# Patient Record
Sex: Female | Born: 1975 | Race: White | Hispanic: No | Marital: Married | Smoking: Never smoker
Health system: Southern US, Community
[De-identification: ages and names within clinical notes are randomized; demographics above are authoritative.]

## PROBLEM LIST (undated history)

## (undated) DIAGNOSIS — I10 Essential (primary) hypertension: Secondary | ICD-10-CM

## (undated) DIAGNOSIS — K219 Gastro-esophageal reflux disease without esophagitis: Secondary | ICD-10-CM

## (undated) DIAGNOSIS — K589 Irritable bowel syndrome without diarrhea: Secondary | ICD-10-CM

## (undated) DIAGNOSIS — E785 Hyperlipidemia, unspecified: Secondary | ICD-10-CM

## (undated) DIAGNOSIS — F32A Depression, unspecified: Secondary | ICD-10-CM

## (undated) DIAGNOSIS — G43909 Migraine, unspecified, not intractable, without status migrainosus: Secondary | ICD-10-CM

## (undated) DIAGNOSIS — R7611 Nonspecific reaction to tuberculin skin test without active tuberculosis: Secondary | ICD-10-CM

## (undated) DIAGNOSIS — T7840XA Allergy, unspecified, initial encounter: Secondary | ICD-10-CM

## (undated) DIAGNOSIS — F329 Major depressive disorder, single episode, unspecified: Secondary | ICD-10-CM

## (undated) HISTORY — DX: Hyperlipidemia, unspecified: E78.5

## (undated) HISTORY — PX: TYMPANOSTOMY TUBE PLACEMENT: SHX32

## (undated) HISTORY — DX: Depression, unspecified: F32.A

## (undated) HISTORY — DX: Essential (primary) hypertension: I10

## (undated) HISTORY — DX: Allergy, unspecified, initial encounter: T78.40XA

## (undated) HISTORY — PX: ADENOIDECTOMY: SUR15

## (undated) HISTORY — DX: Irritable bowel syndrome, unspecified: K58.9

## (undated) HISTORY — DX: Major depressive disorder, single episode, unspecified: F32.9

## (undated) HISTORY — DX: Migraine, unspecified, not intractable, without status migrainosus: G43.909

## (undated) HISTORY — DX: Gastro-esophageal reflux disease without esophagitis: K21.9

## (undated) HISTORY — DX: Nonspecific reaction to tuberculin skin test without active tuberculosis: R76.11

---

## 1999-10-30 ENCOUNTER — Other Ambulatory Visit: Admission: RE | Admit: 1999-10-30 | Discharge: 1999-10-30 | Payer: Self-pay | Admitting: Obstetrics and Gynecology

## 2000-11-02 ENCOUNTER — Other Ambulatory Visit: Admission: RE | Admit: 2000-11-02 | Discharge: 2000-11-02 | Payer: Self-pay | Admitting: Obstetrics and Gynecology

## 2000-12-28 ENCOUNTER — Encounter: Admission: RE | Admit: 2000-12-28 | Discharge: 2000-12-28 | Payer: Self-pay | Admitting: *Deleted

## 2001-01-04 ENCOUNTER — Encounter: Admission: RE | Admit: 2001-01-04 | Discharge: 2001-01-04 | Payer: Self-pay | Admitting: *Deleted

## 2001-01-09 ENCOUNTER — Encounter: Admission: RE | Admit: 2001-01-09 | Discharge: 2001-01-09 | Payer: Self-pay | Admitting: *Deleted

## 2001-01-10 ENCOUNTER — Encounter: Admission: RE | Admit: 2001-01-10 | Discharge: 2001-01-10 | Payer: Self-pay | Admitting: *Deleted

## 2001-01-25 ENCOUNTER — Encounter: Admission: RE | Admit: 2001-01-25 | Discharge: 2001-01-25 | Payer: Self-pay | Admitting: *Deleted

## 2001-02-15 ENCOUNTER — Encounter: Admission: RE | Admit: 2001-02-15 | Discharge: 2001-02-15 | Payer: Self-pay | Admitting: *Deleted

## 2001-02-22 ENCOUNTER — Encounter: Admission: RE | Admit: 2001-02-22 | Discharge: 2001-02-22 | Payer: Self-pay | Admitting: *Deleted

## 2001-03-01 ENCOUNTER — Encounter: Admission: RE | Admit: 2001-03-01 | Discharge: 2001-03-01 | Payer: Self-pay | Admitting: *Deleted

## 2001-03-21 ENCOUNTER — Encounter: Payer: Self-pay | Admitting: Family Medicine

## 2001-03-21 ENCOUNTER — Encounter: Admission: RE | Admit: 2001-03-21 | Discharge: 2001-03-21 | Payer: Self-pay | Admitting: Family Medicine

## 2001-05-31 ENCOUNTER — Encounter: Payer: Self-pay | Admitting: Emergency Medicine

## 2001-05-31 ENCOUNTER — Emergency Department (HOSPITAL_COMMUNITY): Admission: EM | Admit: 2001-05-31 | Discharge: 2001-05-31 | Payer: Self-pay | Admitting: Emergency Medicine

## 2001-11-16 ENCOUNTER — Other Ambulatory Visit: Admission: RE | Admit: 2001-11-16 | Discharge: 2001-11-16 | Payer: Self-pay | Admitting: Obstetrics and Gynecology

## 2002-11-29 ENCOUNTER — Other Ambulatory Visit: Admission: RE | Admit: 2002-11-29 | Discharge: 2002-11-29 | Payer: Self-pay | Admitting: *Deleted

## 2003-04-28 ENCOUNTER — Inpatient Hospital Stay (HOSPITAL_COMMUNITY): Admission: EM | Admit: 2003-04-28 | Discharge: 2003-05-02 | Payer: Self-pay | Admitting: Internal Medicine

## 2003-04-28 ENCOUNTER — Emergency Department (HOSPITAL_COMMUNITY): Admission: AD | Admit: 2003-04-28 | Discharge: 2003-04-28 | Payer: Self-pay | Admitting: Family Medicine

## 2003-10-31 ENCOUNTER — Other Ambulatory Visit: Admission: RE | Admit: 2003-10-31 | Discharge: 2003-10-31 | Payer: Self-pay | Admitting: Obstetrics and Gynecology

## 2004-02-04 ENCOUNTER — Ambulatory Visit: Payer: Self-pay | Admitting: Family Medicine

## 2004-03-02 ENCOUNTER — Ambulatory Visit: Payer: Self-pay | Admitting: Family Medicine

## 2004-03-09 ENCOUNTER — Ambulatory Visit: Payer: Self-pay | Admitting: Family Medicine

## 2004-06-22 ENCOUNTER — Ambulatory Visit: Payer: Self-pay | Admitting: Family Medicine

## 2004-10-16 ENCOUNTER — Ambulatory Visit: Payer: Self-pay | Admitting: Family Medicine

## 2005-01-21 ENCOUNTER — Other Ambulatory Visit: Admission: RE | Admit: 2005-01-21 | Discharge: 2005-01-21 | Payer: Self-pay | Admitting: Obstetrics and Gynecology

## 2005-01-26 ENCOUNTER — Ambulatory Visit: Payer: Self-pay | Admitting: Family Medicine

## 2005-07-06 ENCOUNTER — Ambulatory Visit: Payer: Self-pay | Admitting: Family Medicine

## 2005-07-15 ENCOUNTER — Ambulatory Visit: Payer: Self-pay | Admitting: Family Medicine

## 2005-11-18 ENCOUNTER — Ambulatory Visit: Payer: Self-pay | Admitting: Internal Medicine

## 2005-12-01 ENCOUNTER — Ambulatory Visit: Payer: Self-pay | Admitting: Family Medicine

## 2005-12-01 LAB — CONVERTED CEMR LAB
Chloride: 105 meq/L (ref 96–112)
GFR calc non Af Amer: 104 mL/min
Glucose, Bld: 86 mg/dL (ref 70–99)
Potassium: 5.1 meq/L (ref 3.5–5.1)
Sodium: 139 meq/L (ref 135–145)

## 2005-12-19 ENCOUNTER — Emergency Department (HOSPITAL_COMMUNITY): Admission: EM | Admit: 2005-12-19 | Discharge: 2005-12-19 | Payer: Self-pay | Admitting: Family Medicine

## 2006-01-18 ENCOUNTER — Encounter: Payer: Self-pay | Admitting: Family Medicine

## 2006-01-18 LAB — CONVERTED CEMR LAB

## 2006-01-27 ENCOUNTER — Ambulatory Visit: Payer: Self-pay | Admitting: Family Medicine

## 2006-03-10 ENCOUNTER — Encounter: Admission: RE | Admit: 2006-03-10 | Discharge: 2006-03-10 | Payer: Self-pay | Admitting: Allergy and Immunology

## 2006-04-04 ENCOUNTER — Ambulatory Visit: Payer: Self-pay | Admitting: Family Medicine

## 2006-06-14 ENCOUNTER — Encounter: Payer: Self-pay | Admitting: Family Medicine

## 2006-06-14 DIAGNOSIS — E785 Hyperlipidemia, unspecified: Secondary | ICD-10-CM | POA: Insufficient documentation

## 2006-06-14 DIAGNOSIS — G43909 Migraine, unspecified, not intractable, without status migrainosus: Secondary | ICD-10-CM | POA: Insufficient documentation

## 2006-06-14 DIAGNOSIS — K589 Irritable bowel syndrome without diarrhea: Secondary | ICD-10-CM

## 2006-06-14 DIAGNOSIS — F329 Major depressive disorder, single episode, unspecified: Secondary | ICD-10-CM

## 2006-06-14 DIAGNOSIS — J309 Allergic rhinitis, unspecified: Secondary | ICD-10-CM | POA: Insufficient documentation

## 2006-08-22 ENCOUNTER — Encounter: Payer: Self-pay | Admitting: Internal Medicine

## 2006-08-22 ENCOUNTER — Ambulatory Visit: Payer: Self-pay | Admitting: Internal Medicine

## 2006-08-22 DIAGNOSIS — L259 Unspecified contact dermatitis, unspecified cause: Secondary | ICD-10-CM

## 2006-10-03 ENCOUNTER — Telehealth: Payer: Self-pay | Admitting: Family Medicine

## 2006-10-14 ENCOUNTER — Ambulatory Visit: Payer: Self-pay | Admitting: Family Medicine

## 2006-10-19 ENCOUNTER — Ambulatory Visit: Payer: Self-pay | Admitting: Family Medicine

## 2006-10-19 LAB — CONVERTED CEMR LAB
AST: 18 units/L (ref 0–37)
Bilirubin, Direct: 0.1 mg/dL (ref 0.0–0.3)
Chloride: 110 meq/L (ref 96–112)
Creatinine, Ser: 0.7 mg/dL (ref 0.4–1.2)
Direct LDL: 165.5 mg/dL
Eosinophils Relative: 1.7 % (ref 0.0–5.0)
Glucose, Bld: 74 mg/dL (ref 70–99)
HCT: 43 % (ref 36.0–46.0)
Hemoglobin: 14.8 g/dL (ref 12.0–15.0)
MCV: 84.7 fL (ref 78.0–100.0)
Monocytes Absolute: 0.5 10*3/uL (ref 0.2–0.7)
Neutrophils Relative %: 55 % (ref 43.0–77.0)
Potassium: 3.9 meq/L (ref 3.5–5.1)
RBC: 5.08 M/uL (ref 3.87–5.11)
RDW: 12.6 % (ref 11.5–14.6)
Sodium: 145 meq/L (ref 135–145)
Total Bilirubin: 0.5 mg/dL (ref 0.3–1.2)
Total CHOL/HDL Ratio: 7
Total Protein: 6.4 g/dL (ref 6.0–8.3)
Triglycerides: 108 mg/dL (ref 0–149)
WBC: 6.3 10*3/uL (ref 4.5–10.5)

## 2006-10-20 ENCOUNTER — Telehealth: Payer: Self-pay | Admitting: Family Medicine

## 2006-10-21 ENCOUNTER — Telehealth: Payer: Self-pay | Admitting: Family Medicine

## 2006-11-25 ENCOUNTER — Ambulatory Visit: Payer: Self-pay | Admitting: Family Medicine

## 2007-02-02 ENCOUNTER — Telehealth: Payer: Self-pay | Admitting: Family Medicine

## 2007-02-13 ENCOUNTER — Ambulatory Visit: Payer: Self-pay | Admitting: Family Medicine

## 2007-02-13 DIAGNOSIS — M26609 Unspecified temporomandibular joint disorder, unspecified side: Secondary | ICD-10-CM | POA: Insufficient documentation

## 2007-02-24 ENCOUNTER — Telehealth: Payer: Self-pay | Admitting: Family Medicine

## 2007-03-06 ENCOUNTER — Telehealth: Payer: Self-pay | Admitting: Family Medicine

## 2007-03-07 ENCOUNTER — Ambulatory Visit: Payer: Self-pay | Admitting: Family Medicine

## 2007-03-07 ENCOUNTER — Telehealth: Payer: Self-pay | Admitting: Family Medicine

## 2007-03-07 DIAGNOSIS — R21 Rash and other nonspecific skin eruption: Secondary | ICD-10-CM | POA: Insufficient documentation

## 2007-03-28 ENCOUNTER — Ambulatory Visit: Payer: Self-pay | Admitting: Family Medicine

## 2007-03-28 DIAGNOSIS — J069 Acute upper respiratory infection, unspecified: Secondary | ICD-10-CM | POA: Insufficient documentation

## 2007-04-24 ENCOUNTER — Telehealth: Payer: Self-pay | Admitting: Family Medicine

## 2007-04-24 ENCOUNTER — Ambulatory Visit: Payer: Self-pay | Admitting: Family Medicine

## 2007-04-26 ENCOUNTER — Ambulatory Visit: Payer: Self-pay | Admitting: Family Medicine

## 2007-04-26 DIAGNOSIS — J209 Acute bronchitis, unspecified: Secondary | ICD-10-CM

## 2007-04-27 ENCOUNTER — Encounter: Payer: Self-pay | Admitting: Family Medicine

## 2007-05-26 ENCOUNTER — Telehealth: Payer: Self-pay | Admitting: Family Medicine

## 2007-06-19 ENCOUNTER — Encounter: Payer: Self-pay | Admitting: Family Medicine

## 2007-07-06 ENCOUNTER — Ambulatory Visit: Payer: Self-pay | Admitting: Family Medicine

## 2007-07-07 LAB — CONVERTED CEMR LAB
ALT: 33 units/L (ref 0–35)
Basophils Absolute: 0 10*3/uL (ref 0.0–0.1)
Bilirubin, Direct: 0.1 mg/dL (ref 0.0–0.3)
CO2: 24 meq/L (ref 19–32)
Calcium: 8.8 mg/dL (ref 8.4–10.5)
Hemoglobin: 14.9 g/dL (ref 12.0–15.0)
Lymphocytes Relative: 30.4 % (ref 12.0–46.0)
MCHC: 34.8 g/dL (ref 30.0–36.0)
Neutro Abs: 5.4 10*3/uL (ref 1.4–7.7)
Neutrophils Relative %: 62.8 % (ref 43.0–77.0)
Platelets: 328 10*3/uL (ref 150–400)
Potassium: 3.9 meq/L (ref 3.5–5.1)
RDW: 13.6 % (ref 11.5–14.6)
Sodium: 140 meq/L (ref 135–145)
TSH: 1.82 microintl units/mL (ref 0.35–5.50)
Total Bilirubin: 0.4 mg/dL (ref 0.3–1.2)

## 2007-07-27 ENCOUNTER — Encounter: Payer: Self-pay | Admitting: Family Medicine

## 2007-09-01 ENCOUNTER — Ambulatory Visit: Payer: Self-pay | Admitting: Family Medicine

## 2007-09-01 DIAGNOSIS — K219 Gastro-esophageal reflux disease without esophagitis: Secondary | ICD-10-CM

## 2007-09-01 DIAGNOSIS — I10 Essential (primary) hypertension: Secondary | ICD-10-CM | POA: Insufficient documentation

## 2007-09-01 DIAGNOSIS — J45909 Unspecified asthma, uncomplicated: Secondary | ICD-10-CM

## 2007-09-07 ENCOUNTER — Telehealth: Payer: Self-pay | Admitting: Family Medicine

## 2007-09-08 ENCOUNTER — Ambulatory Visit: Payer: Self-pay | Admitting: Family Medicine

## 2007-09-12 ENCOUNTER — Telehealth: Payer: Self-pay | Admitting: Family Medicine

## 2007-09-18 ENCOUNTER — Encounter: Payer: Self-pay | Admitting: Family Medicine

## 2007-10-02 ENCOUNTER — Telehealth: Payer: Self-pay | Admitting: Family Medicine

## 2007-10-20 ENCOUNTER — Telehealth: Payer: Self-pay | Admitting: Family Medicine

## 2007-10-31 ENCOUNTER — Ambulatory Visit: Payer: Self-pay | Admitting: Family Medicine

## 2007-11-22 ENCOUNTER — Telehealth: Payer: Self-pay | Admitting: Family Medicine

## 2008-01-04 ENCOUNTER — Encounter: Payer: Self-pay | Admitting: Family Medicine

## 2008-03-19 ENCOUNTER — Ambulatory Visit: Payer: Self-pay | Admitting: Family Medicine

## 2008-04-05 ENCOUNTER — Telehealth: Payer: Self-pay | Admitting: Family Medicine

## 2008-07-04 ENCOUNTER — Ambulatory Visit: Payer: Self-pay | Admitting: Family Medicine

## 2008-08-06 ENCOUNTER — Telehealth: Payer: Self-pay | Admitting: Family Medicine

## 2008-08-28 ENCOUNTER — Telehealth: Payer: Self-pay | Admitting: Family Medicine

## 2008-09-16 ENCOUNTER — Ambulatory Visit: Payer: Self-pay | Admitting: Family Medicine

## 2008-10-02 ENCOUNTER — Telehealth: Payer: Self-pay | Admitting: Family Medicine

## 2008-10-15 ENCOUNTER — Ambulatory Visit: Payer: Self-pay | Admitting: Family Medicine

## 2008-10-15 DIAGNOSIS — J019 Acute sinusitis, unspecified: Secondary | ICD-10-CM | POA: Insufficient documentation

## 2008-10-17 ENCOUNTER — Telehealth: Payer: Self-pay | Admitting: Family Medicine

## 2008-10-30 ENCOUNTER — Telehealth: Payer: Self-pay | Admitting: Family Medicine

## 2008-11-28 ENCOUNTER — Ambulatory Visit: Payer: Self-pay | Admitting: Licensed Clinical Social Worker

## 2008-12-05 ENCOUNTER — Ambulatory Visit: Payer: Self-pay | Admitting: Licensed Clinical Social Worker

## 2008-12-20 ENCOUNTER — Telehealth: Payer: Self-pay | Admitting: Family Medicine

## 2009-01-22 ENCOUNTER — Telehealth: Payer: Self-pay | Admitting: Family Medicine

## 2009-01-24 ENCOUNTER — Ambulatory Visit: Payer: Self-pay | Admitting: Family Medicine

## 2009-03-03 ENCOUNTER — Telehealth: Payer: Self-pay | Admitting: Family Medicine

## 2009-03-10 ENCOUNTER — Telehealth: Payer: Self-pay | Admitting: Family Medicine

## 2009-04-14 ENCOUNTER — Ambulatory Visit: Payer: Self-pay | Admitting: Family Medicine

## 2009-04-17 ENCOUNTER — Telehealth: Payer: Self-pay | Admitting: Family Medicine

## 2009-08-22 ENCOUNTER — Ambulatory Visit: Payer: Self-pay | Admitting: Family Medicine

## 2009-08-22 LAB — CONVERTED CEMR LAB
ALT: 21 units/L (ref 0–35)
AST: 20 units/L (ref 0–37)
Alkaline Phosphatase: 83 units/L (ref 39–117)
Eosinophils Relative: 2.5 % (ref 0.0–5.0)
GFR calc non Af Amer: 106.13 mL/min (ref >60)
HCT: 45.1 % (ref 36.0–46.0)
HDL: 40.4 mg/dL (ref >39.00)
Hemoglobin: 15.3 g/dL — ABNORMAL HIGH (ref 12.0–15.0)
Lymphs Abs: 2.3 10*3/uL (ref 0.7–4.0)
Monocytes Relative: 7 % (ref 3.0–12.0)
Nitrite: NEGATIVE
Platelets: 322 10*3/uL (ref 150.0–400.0)
Potassium: 4.4 meq/L (ref 3.5–5.1)
Protein, U semiquant: NEGATIVE
Sodium: 145 meq/L (ref 135–145)
TSH: 3.67 microintl units/mL (ref 0.35–5.50)
Total Bilirubin: 0.4 mg/dL (ref 0.3–1.2)
Urobilinogen, UA: 0.2
VLDL: 21 mg/dL (ref 0.0–40.0)
WBC Urine, dipstick: NEGATIVE
WBC: 6 10*3/uL (ref 4.5–10.5)

## 2009-08-29 ENCOUNTER — Ambulatory Visit: Payer: Self-pay | Admitting: Family Medicine

## 2009-09-23 ENCOUNTER — Telehealth: Payer: Self-pay | Admitting: Family Medicine

## 2009-09-23 DIAGNOSIS — M79609 Pain in unspecified limb: Secondary | ICD-10-CM | POA: Insufficient documentation

## 2009-11-04 ENCOUNTER — Ambulatory Visit: Payer: Self-pay | Admitting: Family Medicine

## 2009-12-17 ENCOUNTER — Ambulatory Visit: Payer: Self-pay | Admitting: Family Medicine

## 2009-12-19 ENCOUNTER — Telehealth: Payer: Self-pay | Admitting: Family Medicine

## 2009-12-29 ENCOUNTER — Telehealth: Payer: Self-pay | Admitting: Family Medicine

## 2010-01-02 ENCOUNTER — Ambulatory Visit: Payer: Self-pay | Admitting: Family Medicine

## 2010-01-27 ENCOUNTER — Telehealth: Payer: Self-pay | Admitting: Family Medicine

## 2010-02-15 LAB — CONVERTED CEMR LAB
ALT: 41 units/L — ABNORMAL HIGH (ref 0–35)
AST: 22 units/L (ref 0–37)
Bilirubin, Direct: 0.1 mg/dL (ref 0.0–0.3)
CO2: 27 meq/L (ref 19–32)
Chloride: 103 meq/L (ref 96–112)
Creatinine, Ser: 0.7 mg/dL (ref 0.4–1.2)
Sodium: 141 meq/L (ref 135–145)
Total Bilirubin: 0.5 mg/dL (ref 0.3–1.2)

## 2010-02-17 NOTE — Assessment & Plan Note (Signed)
Summary: DEPO INJ/RCD   Nurse Visit   Allergies: 1)  Sulfamethoxazole (Sulfamethoxazole) 2)  Avelox (Moxifloxacin Hcl) 3)  Codeine Phosphate (Codeine Phosphate)  Medication Administration  Injection # 1:    Medication: Depo- Medrol 80mg     Diagnosis: ACUTE BRONCHITIS (ICD-466.0)    Route: IM    Site: LUOQ gluteus    Exp Date: 10/11    Lot #: [id number]    Mfr: Teva    Comments: 120mg     Patient tolerated injection without complications    Given by: Alfred Levins, CMA (January 24, 2009 12:45 PM)  Orders Added: 1)  Depo- Medrol 80mg  [J1040] 2)  Admin of Therapeutic Inj  intramuscular or subcutaneous [54098]

## 2010-02-17 NOTE — Progress Notes (Signed)
Summary: REQUEST FOR COUGH MEDICINE  Phone Note Call from Patient Call back at Home Phone [phone]   Caller: Patient Details for Reason: REQUEST FOR COUGH MEDICINE Summary of Call: Patient request that something for a cough be called in to her pharmacy ([facility] - [address]) Initial call taken by: Roney Jaffe,  December 19, 2009 7:19 AM  Follow-up for Phone Call        call in Hydromet 1-2 tsp q 4 hours as needed cough, 240 ml, no rf  Follow-up by: Nelwyn Salisbury MD,  December 19, 2009 8:23 AM  Additional Follow-up for Phone Call Additional follow up Details #1::        done pt aware  Additional Follow-up by: Pura Spice, RN,  December 19, 2009 9:48 AM    New/Updated Medications: HYDROMET 5-1.5 MG/5ML SYRP (HYDROCODONE-HOMATROPINE) 1-2 tsp every 4 hrs as needed cough Prescriptions: HYDROMET 5-1.5 MG/5ML SYRP (HYDROCODONE-HOMATROPINE) 1-2 tsp every 4 hrs as needed cough  #254ml x 0   Entered by:   Pura Spice, RN   Authorized by:   Nelwyn Salisbury MD   Signed by:   Pura Spice, RN on 12/19/2009   Method used:   Telephoned to ...       [address] [phone]* (retail)       [address]       [place], [place]  [address]       Ph: [phone]       Fax: [phone]   RxID:   [id number]

## 2010-02-17 NOTE — Progress Notes (Signed)
Summary: rt toe pain  Phone Note Call from Patient Call back at Work Phone [phone]   Caller: Patient Summary of Call: she is still having a lot of pain in the right great toe despite wearing supportive shoes and taking Motrin.  Initial call taken by: Nelwyn Salisbury MD,  September 23, 2009 8:51 AM  Follow-up for Phone Call        refer to Dr. Orlene Och ASAP for right great toe pain Follow-up by: Nelwyn Salisbury MD,  September 23, 2009 8:51 AM  Additional Follow-up for Phone Call Additional follow up Details #1::        done pt aware. Additional Follow-up by: Pura Spice, RN,  September 23, 2009 9:25 AM  New Problems: FOOT PAIN, RIGHT (ICD-729.5)   New Problems: FOOT PAIN, RIGHT (ICD-729.5)

## 2010-02-17 NOTE — Assessment & Plan Note (Signed)
Summary: SINUSES/HEADACHE/RCD   Vital Signs:  Patient profile:   35 year old female Weight:      195 pounds Temp:     98.8 degrees F oral BP sitting:   120 / 88  (left arm) Cuff size:   regular  Vitals Entered By: Raechel Ache, RN (April 14, 2009 9:15 AM) CC: C/o sinus pressure, sore throat, runny nose, cough, chills, weak.   History of Present Illness: Here with 5 days of sinus pressure, HA, ST, and a dry cough.No fever.   Allergies: 1)  Sulfamethoxazole (Sulfamethoxazole) 2)  Avelox (Moxifloxacin Hcl) 3)  Codeine Phosphate (Codeine Phosphate)  Past History:  Past Medical History: Reviewed history from 09/01/2007 and no changes required. Allergic rhinitis Depression Hyperlipidemia pos PPD, treated with INH sees Julio Sicks (with Dr. Vincente Poli) for gyn exams migraines, has seen Dr. Neale Burly IBS Asthma, sees DR. Whelan Hypertension GERD  Review of Systems  The patient denies anorexia, fever, weight loss, weight gain, vision loss, decreased hearing, hoarseness, chest pain, syncope, dyspnea on exertion, peripheral edema, hemoptysis, abdominal pain, melena, hematochezia, severe indigestion/heartburn, hematuria, incontinence, genital sores, muscle weakness, suspicious skin lesions, transient blindness, difficulty walking, depression, unusual weight change, abnormal bleeding, enlarged lymph nodes, angioedema, breast masses, and testicular masses.    Physical Exam  General:  Well-developed,well-nourished,in no acute distress; alert,appropriate and cooperative throughout examination Head:  Normocephalic and atraumatic without obvious abnormalities. No apparent alopecia or balding. Eyes:  No corneal or conjunctival inflammation noted. EOMI. Perrla. Funduscopic exam benign, without hemorrhages, exudates or papilledema. Vision grossly normal. Ears:  External ear exam shows no significant lesions or deformities.  Otoscopic examination reveals clear canals, tympanic membranes are  intact bilaterally without bulging, retraction, inflammation or discharge. Hearing is grossly normal bilaterally. Nose:  External nasal examination shows no deformity or inflammation. Nasal mucosa are pink and moist without lesions or exudates. Mouth:  Oral mucosa and oropharynx without lesions or exudates.  Teeth in good repair. Neck:  No deformities, masses, or tenderness noted. Lungs:  Normal respiratory effort, chest expands symmetrically. Lungs are clear to auscultation, no crackles or wheezes.   Impression & Recommendations:  Problem # 1:  ACUTE SINUSITIS, UNSPECIFIED (ICD-461.9)  Her updated medication list for this problem includes:    Flonase 50 Mcg/act Susp (Fluticasone propionate) .Marland Kitchen... 2 sprays each nostril once daily    Augmentin 875-125 Mg Tabs (Amoxicillin-pot clavulanate) .Marland Kitchen..Marland Kitchen Two times a day  Orders: Depo- Medrol 80mg  (J1040) Admin of Therapeutic Inj  intramuscular or subcutaneous (84665)  Complete Medication List: 1)  Lorazepam 0.5 Mg Tabs (Lorazepam) .... Three times a day as needed anxiety 2)  Zantac 150 Mg Caps (Ranitidine hcl) .... Once daily 3)  Pulmicort Flexhaler 90 Mcg/act Aepb (Budesonide) .... As needed 4)  Promethazine Hcl 25 Mg Tabs (Promethazine hcl) .Marland Kitchen.. 1 every 4 hours as needed nausea 5)  Ketorolac Tromethamine 10 Mg Tabs (Ketorolac tromethamine) .... Three times a day as needed for ha 6)  Treximet 85-500 Mg Tabs (Sumatriptan-naproxen sodium) .... As needed for ha 7)  Xyzal 5 Mg Tabs (Levocetirizine dihydrochloride) .... Once daily 8)  Flonase 50 Mcg/act Susp (Fluticasone propionate) .... 2 sprays each nostril once daily 9)  Topamax 100 Mg Tabs (Topiramate) .... At bedtime 10)  Fluconazole 150 Mg Tabs (Fluconazole) .... As needed 11)  Lunesta 3 Mg Tabs (Eszopiclone) .... At bedtime 12)  Nexium 40 Mg Cpdr (Esomeprazole magnesium) .... Once daily 13)  Augmentin 875-125 Mg Tabs (Amoxicillin-pot clavulanate) .... Two times a day 14)  Terazol 7 0.4 %  Crea (Terconazole) .... As directed  Patient Instructions: 1)  Please schedule a follow-up appointment as needed .  Prescriptions: TERAZOL 7 0.4 % CREA (TERCONAZOLE) as directed  #1 x 0   Entered and Authorized by:   Nelwyn Salisbury MD   Signed by:   Nelwyn Salisbury MD on 04/14/2009   Method used:   Electronically to        [facility]* (retail)       [address]       [address]/mailing       [place], [place]  [address]       Ph: [phone]       Fax: [phone]   RxID:   [id number] AUGMENTIN 875-125 MG TABS (AMOXICILLIN-POT CLAVULANATE) two times a day  #20 x 0   Entered and Authorized by:   Nelwyn Salisbury MD   Signed by:   Nelwyn Salisbury MD on 04/14/2009   Method used:   Electronically to        [facility]* (retail)       [address]       [address]mail[address]       [place], [place]  [address]       Ph: [phone]       Fax: [phone]   RxID:   [id number]    Medication Administration  Injection # 1:    Medication: Depo- Medrol 80mg     Diagnosis: ACUTE SINUSITIS, UNSPECIFIED (ICD-461.9)    Route: IM    Site: RUOQ gluteus    Exp Date: 11/2011    Lot #: [id number]    Mfr: Pharmacia    Comments: 120 mg given.    Patient tolerated injection without complications    Given by: Raechel Ache, RN (April 14, 2009 9:36 AM)  Orders Added: 1)  Est. Patient Level IV [70623] 2)  Depo- Medrol 80mg  [J1040] 3)  Admin of Therapeutic Inj  intramuscular or subcutaneous [76283]

## 2010-02-17 NOTE — Assessment & Plan Note (Signed)
Summary: cpx no pap//ccm/pt rescd ok per dr fry/ccm   Vital Signs:  Patient profile:   35 year old female Height:      62.5 inches Weight:      195 pounds BMI:     35.22 Pulse rate:   92 / minute Pulse rhythm:   regular BP sitting:   126 / 84  (left arm) Cuff size:   regular  Vitals Entered By: Raechel Ache, RN (August 29, 2009 1:22 PM) CC: CPX, labs done. C/o pain R great toe joint. Sees gyn.   History of Present Illness: 35 yr old female for a cpx. She is doing well. Her asthma is stable, and her GERD is under control. Her HAs are better on Topamax but she still gets some breakthrough HAs around her menses.   Allergies: 1)  Sulfamethoxazole (Sulfamethoxazole) 2)  Avelox (Moxifloxacin Hcl) 3)  Codeine Phosphate (Codeine Phosphate)  Past History:  Past Medical History: Reviewed history from 09/01/2007 and no changes required. Allergic rhinitis Depression Hyperlipidemia pos PPD, treated with INH sees Julio Sicks (with Dr. Vincente Poli) for gyn exams migraines, has seen Dr. Neale Burly IBS Asthma, sees DR. Whelan Hypertension GERD  Past Surgical History: Reviewed history from 09/01/2007 and no changes required. Adenoids removed Tubes in ears  Family History: Reviewed history from 09/01/2007 and no changes required. COPD Family History of Asthma Family History of CAD Female 1st degree relative <50  Social History: Reviewed history from 09/01/2007 and no changes required. Married Never Smoked Alcohol use-no Occupation:nursing aide  Review of Systems  The patient denies anorexia, fever, weight loss, weight gain, vision loss, decreased hearing, hoarseness, chest pain, syncope, dyspnea on exertion, peripheral edema, prolonged cough, hemoptysis, abdominal pain, melena, hematochezia, severe indigestion/heartburn, hematuria, incontinence, genital sores, muscle weakness, suspicious skin lesions, transient blindness, difficulty walking, depression, unusual weight change,  abnormal bleeding, enlarged lymph nodes, angioedema, breast masses, and testicular masses.    Physical Exam  General:  overweight-appearing.   Head:  Normocephalic and atraumatic without obvious abnormalities. No apparent alopecia or balding. Eyes:  No corneal or conjunctival inflammation noted. EOMI. Perrla. Funduscopic exam benign, without hemorrhages, exudates or papilledema. Vision grossly normal. Ears:  External ear exam shows no significant lesions or deformities.  Otoscopic examination reveals clear canals, tympanic membranes are intact bilaterally without bulging, retraction, inflammation or discharge. Hearing is grossly normal bilaterally. Nose:  External nasal examination shows no deformity or inflammation. Nasal mucosa are pink and moist without lesions or exudates. Mouth:  Oral mucosa and oropharynx without lesions or exudates.  Teeth in good repair. Neck:  No deformities, masses, or tenderness noted. Chest Wall:  No deformities, masses, or tenderness noted. Lungs:  Normal respiratory effort, chest expands symmetrically. Lungs are clear to auscultation, no crackles or wheezes. Heart:  Normal rate and regular rhythm. S1 and S2 normal without gallop, murmur, click, rub or other extra sounds. Abdomen:  Bowel sounds positive,abdomen soft and non-tender without masses, organomegaly or hernias noted. Msk:  No deformity or scoliosis noted of thoracic or lumbar spine.   Pulses:  R and L carotid,radial,femoral,dorsalis pedis and posterior tibial pulses are full and equal bilaterally Extremities:  No clubbing, cyanosis, edema, or deformity noted with normal full range of motion of all joints.   Neurologic:  No cranial nerve deficits noted. Station and gait are normal. Plantar reflexes are down-going bilaterally. DTRs are symmetrical throughout. Sensory, motor and coordinative functions appear intact. Skin:  Intact without suspicious lesions or rashes Cervical Nodes:  No lymphadenopathy  noted Axillary Nodes:  No palpable lymphadenopathy Inguinal Nodes:  No significant adenopathy Psych:  Cognition and judgment appear intact. Alert and cooperative with normal attention span and concentration. No apparent delusions, illusions, hallucinations   Impression & Recommendations:  Problem # 1:  PHYSICAL EXAMINATION (ICD-V70.0)  Complete Medication List: 1)  Lorazepam 0.5 Mg Tabs (Lorazepam) .... Three times a day as needed anxiety 2)  Pulmicort Flexhaler 90 Mcg/act Aepb (Budesonide) .... As needed 3)  Promethazine Hcl 25 Mg Tabs (Promethazine hcl) .Marland Kitchen.. 1 every 4 hours as needed nausea 4)  Ketorolac Tromethamine 10 Mg Tabs (Ketorolac tromethamine) .... Three times a day as needed for ha 5)  Treximet 85-500 Mg Tabs (Sumatriptan-naproxen sodium) .... As needed for ha 6)  Xyzal 5 Mg Tabs (Levocetirizine dihydrochloride) .... Once daily 7)  Flonase 50 Mcg/act Susp (Fluticasone propionate) .... 2 sprays each nostril once daily 8)  Topamax 100 Mg Tabs (Topiramate) .... At bedtime 9)  Lunesta 3 Mg Tabs (Eszopiclone) .... At bedtime 10)  Nexium 40 Mg Cpdr (Esomeprazole magnesium) .... Once daily  Patient Instructions: 1)  It is important that you exercise reguarly at least 20 minutes 5 times a week. If you develop chest pain, have severe difficulty breathing, or feel very tired, stop exercising immediately and seek medical attention.  2)  You need to lose weight. Consider a lower calorie diet and regular exercise.  3)  Try increasing Topamax to 2 tabs (200 mg) a day for a month, then let me know Prescriptions: LORAZEPAM 0.5 MG TABS (LORAZEPAM) three times a day as needed anxiety  #60 x 2   Entered and Authorized by:   Nelwyn Salisbury MD   Signed by:   Nelwyn Salisbury MD on 08/29/2009   Method used:   Print then Give to Patient   RxID:   [id number]    Immunization History:  Hepatitis B Immunization History:    Hepatitis B # 1:  historical (05/05/1995)    Hepatitis B # 2:   historical (05/31/1995)    Hepatitis B # 3:  historical (11/01/1995)  MMR Immunization History:    MMR # 1:  historical (04/19/1975)    MMR # 2:  historical (05/31/1995)  Varicella Immunization History:    History of chickenpox:  yes (08/18/1977)

## 2010-02-17 NOTE — Progress Notes (Signed)
Summary: rx for nexium and going back to lunesta 3mg   Phone Note Call from Patient Call back at [facility] Phone [phone]   Caller: Patient Summary of Call: Lunesta 2mg  didn't help. Tossed and turned both friday and sat night. Would like to go back on 3mg . Also need a rx for aciphex. But would like to have nexium instead called into [facility] because it doesn't cost anything. Initial call taken by: Romualdo Bolk, CMA Duncan Dull),  March 10, 2009 7:49 AM  Follow-up for Phone Call        done Follow-up by: Nelwyn Salisbury MD,  March 10, 2009 8:21 AM    New/Updated Medications: LUNESTA 3 MG TABS (ESZOPICLONE) at bedtime NEXIUM 40 MG CPDR (ESOMEPRAZOLE MAGNESIUM) once daily Prescriptions: NEXIUM 40 MG CPDR (ESOMEPRAZOLE MAGNESIUM) once daily  #30 x 11   Entered and Authorized by:   Nelwyn Salisbury MD   Signed by:   Nelwyn Salisbury MD on 03/10/2009   Method used:   Print then Give to Patient   RxID:   [id number] LUNESTA 3 MG TABS (ESZOPICLONE) at bedtime  #30 x 5   Entered and Authorized by:   Nelwyn Salisbury MD   Signed by:   Nelwyn Salisbury MD on 03/10/2009   Method used:   Print then Give to Patient   RxID:   [id number]

## 2010-02-17 NOTE — Assessment & Plan Note (Signed)
Summary: sinuses//ccm   Vital Signs:  Patient profile:   35 year old female Weight:      199 pounds O2 Sat:      98 % Temp:     99.3 degrees F Pulse rate:   106 / minute BP sitting:   130 / 94  (left arm) Cuff size:   large  Vitals Entered By: Pura Spice, RN (December 17, 2009 9:29 AM) CC: sinus cough headache took otc tylenol cold and nyquil last nite    History of Present Illness: Here for 4 days of sinus pressure and pain, HA, PND, and a dry cough. Some fever. On Tylenol and Sudafed.   Allergies: 1)  ! * Topamax 2)  Sulfamethoxazole (Sulfamethoxazole) 3)  Avelox (Moxifloxacin Hcl) 4)  Codeine Phosphate (Codeine Phosphate)  Past History:  Past Medical History: Reviewed history from 09/01/2007 and no changes required. Allergic rhinitis Depression Hyperlipidemia pos PPD, treated with INH sees Julio Sicks (with Dr. Vincente Poli) for gyn exams migraines, has seen Dr. Neale Burly IBS Asthma, sees DR. Whelan Hypertension GERD  Review of Systems  The patient denies anorexia, weight loss, weight gain, vision loss, decreased hearing, hoarseness, chest pain, syncope, dyspnea on exertion, peripheral edema, hemoptysis, abdominal pain, melena, hematochezia, severe indigestion/heartburn, hematuria, incontinence, genital sores, muscle weakness, suspicious skin lesions, transient blindness, difficulty walking, depression, unusual weight change, abnormal bleeding, enlarged lymph nodes, angioedema, breast masses, and testicular masses.    Physical Exam  General:  Well-developed,well-nourished,in no acute distress; alert,appropriate and cooperative throughout examination Head:  Normocephalic and atraumatic without obvious abnormalities. No apparent alopecia or balding. Eyes:  No corneal or conjunctival inflammation noted. EOMI. Perrla. Funduscopic exam benign, without hemorrhages, exudates or papilledema. Vision grossly normal. Ears:  External ear exam shows no significant lesions or  deformities.  Otoscopic examination reveals clear canals, tympanic membranes are intact bilaterally without bulging, retraction, inflammation or discharge. Hearing is grossly normal bilaterally. Nose:  External nasal examination shows no deformity or inflammation. Nasal mucosa are pink and moist without lesions or exudates. Mouth:  Oral mucosa and oropharynx without lesions or exudates.  Teeth in good repair. Neck:  No deformities, masses, or tenderness noted. Lungs:  Normal respiratory effort, chest expands symmetrically. Lungs are clear to auscultation, no crackles or wheezes.   Impression & Recommendations:  Problem # 1:  ACUTE SINUSITIS, UNSPECIFIED (ICD-461.9)  Her updated medication list for this problem includes:    Flonase 50 Mcg/act Susp (Fluticasone propionate) .Marland Kitchen... 2 sprays each nostril once daily    Ceftin 500 Mg Tabs (Cefuroxime axetil) .Marland Kitchen..Marland Kitchen Two times a day  Orders: Depo- Medrol 40mg  (J1030) Depo- Medrol 80mg  (J1040) Admin of Therapeutic Inj  intramuscular or subcutaneous (81191)  Complete Medication List: 1)  Lorazepam 0.5 Mg Tabs (Lorazepam) .... Three times a day as needed anxiety 2)  Pulmicort Flexhaler 90 Mcg/act Aepb (Budesonide) .... As needed 3)  Promethazine Hcl 25 Mg Tabs (Promethazine hcl) .Marland Kitchen.. 1 every 4 hours as needed nausea 4)  Ketorolac Tromethamine 10 Mg Tabs (Ketorolac tromethamine) .... Three times a day as needed for ha 5)  Treximet 85-500 Mg Tabs (Sumatriptan-naproxen sodium) .... As needed for ha 6)  Xyzal 5 Mg Tabs (Levocetirizine dihydrochloride) .... Once daily 7)  Flonase 50 Mcg/act Susp (Fluticasone propionate) .... 2 sprays each nostril once daily 8)  Lunesta 3 Mg Tabs (Eszopiclone) .... At bedtime 9)  Nexium 40 Mg Cpdr (Esomeprazole magnesium) .... Once daily 10)  Venlafaxine Hcl 75 Mg Xr24h-cap (Venlafaxine hcl) .... Once daily 11)  Depakote 125 Mg Tbec (Divalproex sodium) .... Once daily 12)  Ceftin 500 Mg Tabs (Cefuroxime axetil) .... Two  times a day  Patient Instructions: 1)  I suggested she try a [employer] as well.  2)  Please schedule a follow-up appointment as needed .  Prescriptions: CEFTIN 500 MG TABS (CEFUROXIME AXETIL) two times a day  #20 x 0   Entered and Authorized by:   Nelwyn Salisbury MD   Signed by:   Nelwyn Salisbury MD on 12/17/2009   Method used:   Electronically to        [address] [phone]* (retail)       [address]       [place], [place]  [address]       Ph: [phone]       Fax: [phone]   RxID:   [id number]    Medication Administration  Injection # 1:    Medication: Depo- Medrol 40mg     Diagnosis: ACUTE SINUSITIS, UNSPECIFIED (ICD-461.9)    Route: IM    Site: RUOQ gluteus    Exp Date: 07/2012    Lot #: [id number]    Mfr: Pharmacia    Patient tolerated injection without complications    Given by: Pura Spice, RN (December 17, 2009 11:41 AM)  Injection # 2:    Medication: Depo- Medrol 80mg     Diagnosis: ACUTE SINUSITIS, UNSPECIFIED (ICD-461.9)    Route: IM    Site: RUOQ gluteus    Exp Date: 07/2012    Lot #: [id number]    Mfr: Pharmacia    Patient tolerated injection without complications    Given by: Pura Spice, RN (December 17, 2009 11:42 AM)  Orders Added: 1)  Est. Patient Level IV [59563] 2)  Depo- Medrol 40mg  [J1030] 3)  Depo- Medrol 80mg  [J1040] 4)  Admin of Therapeutic Inj  intramuscular or subcutaneous [87564]

## 2010-02-17 NOTE — Letter (Signed)
Summary: Staff/Volunteer Physical Exam Form/[place]  Staff/Volunteer Physical Exam Form/[facility] [place]   Imported By: Maryln Gottron 09/02/2009 09:45:51  _____________________________________________________________________  External Attachment:    Type:   Image     Comment:   External Document

## 2010-02-17 NOTE — Progress Notes (Signed)
Summary: problems with augmentin  Phone Note Call from Patient Call back at Home Phone [phone]   Caller: Patient Summary of Call: Augmentin causing severe diarrhea in am for past 3 days. I take 2 pepto and seem to do okay the rest of the day. But right now, I'm not so sure. Still having diarrhea.  Also I took a xyzal this am and was wondering if it would be okay to take one tonight as well because my symptoms are worse first thing in the am with the nasal congestion and watery eyes? I was thinking if I start taking it at night that would be better than during the day. Initial call taken by: Romualdo Bolk, CMA Duncan Dull),  April 17, 2009 8:32 AM  Follow-up for Phone Call        Go ahead and use Pepto-Bismol as needed. If not better by tomorrow, we can switch the antibiotic . It is okay to use Xyzal at night. Follow-up by: Nelwyn Salisbury MD,  April 17, 2009 9:22 AM

## 2010-02-17 NOTE — Progress Notes (Signed)
Summary: pain with walking  Phone Note Call from Patient   Caller: pt Summary of Call: pt reports pain in rt great toe and foot which is worse than last week. Hx of rt fx toe in 1999. No reddness no swelling. Difficulty in walking. Pt would like to be referred .  Initial call taken by: Pura Spice, RN,  September 23, 2009 8:00 AM  Follow-up for Phone Call        this was taken care of Follow-up by: Nelwyn Salisbury MD,  September 23, 2009 8:56 AM

## 2010-02-17 NOTE — Progress Notes (Signed)
Summary: lunesta and aciphex  Phone Note Call from Patient Call back at [facility] Phone [phone]   Caller: Patient Summary of Call: Aciphex is helping some but still has some chest pains. Also tried lunesta again over the weekend and it did help with sleep but I was alittle tired the next day. Unsure if that was due to my cycle or the lunesta. I will need some samples of lunesta for camp if you want me stay on it. Initial call taken by: Romualdo Bolk, CMA (AAMA),  March 03, 2009 12:52 PM  Follow-up for Phone Call        try Lunesta 2mg  at bedtime, and try taking Aciphex two times a day . Let me know how this works Follow-up by: Nelwyn Salisbury MD,  March 03, 2009 1:18 PM  Additional Follow-up for Phone Call Additional follow up Details #1::        pt aware Additional Follow-up by: Alfred Levins, CMA,  March 03, 2009 1:37 PM    New/Updated Medications: ACIPHEX 20 MG TBEC (RABEPRAZOLE SODIUM) once daily LUNESTA 2 MG TABS (ESZOPICLONE) at bedtime as needed

## 2010-02-17 NOTE — Assessment & Plan Note (Signed)
Summary: MIGRAINES/RCD   Vital Signs:  Patient profile:   35 year old female Weight:      199.5 pounds O2 Sat:      98 % Temp:     98.6 degrees F Pulse rate:   88 / minute BP sitting:   120 / 86  (left arm) Cuff size:   regular  Vitals Entered By: Pura Spice, RN (November 04, 2009 9:47 AM) CC: migraine ha  discuss alternative tx    History of Present Illness: Here for a migraine HA that she woke up with today. This is the worst one she has had in many months. it is mostly over the top of her head, it throbs, and she is very nauseated. She has taken a Treximet this am with no response. She had to stop taking Topamax due to tingling in the hands and feet. She is interested in trying a new prophylactic med. Also, her anxiety and depression have been worse for the past 6 months. She is under a lot of job stress, and she and her husband have had no success in trying to get pregnant. They are working with a fertility clinic, and this has been difficult for both of them.   Allergies: 1)  ! * Topamax 2)  Sulfamethoxazole (Sulfamethoxazole) 3)  Avelox (Moxifloxacin Hcl) 4)  Codeine Phosphate (Codeine Phosphate)  Past History:  Past Medical History: Reviewed history from 09/01/2007 and no changes required. Allergic rhinitis Depression Hyperlipidemia pos PPD, treated with INH sees Julio Sicks (with Dr. Vincente Poli) for gyn exams migraines, has seen Dr. Neale Burly IBS Asthma, sees DR. Whelan Hypertension GERD  Review of Systems  The patient denies anorexia, fever, weight loss, weight gain, vision loss, decreased hearing, hoarseness, chest pain, syncope, dyspnea on exertion, peripheral edema, prolonged cough, hemoptysis, abdominal pain, melena, hematochezia, severe indigestion/heartburn, hematuria, incontinence, genital sores, muscle weakness, suspicious skin lesions, transient blindness, difficulty walking, unusual weight change, abnormal bleeding, enlarged lymph nodes, angioedema, breast  masses, and testicular masses.    Physical Exam  General:  wearing dark glasses, very photophobic, in pain but alert Head:  Normocephalic and atraumatic without obvious abnormalities. No apparent alopecia or balding. Eyes:  No corneal or conjunctival inflammation noted. EOMI. Perrla. Funduscopic exam benign, without hemorrhages, exudates or papilledema. Vision grossly normal. Neck:  No deformities, masses, or tenderness noted. Neurologic:  No cranial nerve deficits noted. Station and gait are normal. Plantar reflexes are down-going bilaterally. DTRs are symmetrical throughout. Sensory, motor and coordinative functions appear intact. Psych:  Oriented X3, memory intact for recent and remote, normally interactive, depressed affect, and tearful.     Impression & Recommendations:  Problem # 1:  MIGRAINE HEADACHE (ICD-346.90)  Her updated medication list for this problem includes:    Ketorolac Tromethamine 10 Mg Tabs (Ketorolac tromethamine) .Marland Kitchen... Three times a day as needed for ha    Treximet 85-500 Mg Tabs (Sumatriptan-naproxen sodium) .Marland Kitchen... As needed for ha  Orders: Ketorolac-Toradol 15mg  [phone]) Admin of Therapeutic Inj  intramuscular or subcutaneous (14782) Demerol  100mg   Injection (N5621) Promethazine up to 50mg  (J2550) Admin of Therapeutic Inj  intramuscular or subcutaneous (30865)  Problem # 2:  DEPRESSION (ICD-311)  Her updated medication list for this problem includes:    Lorazepam 0.5 Mg Tabs (Lorazepam) .Marland Kitchen... Three times a day as needed anxiety    Venlafaxine Hcl 75 Mg Xr24h-cap (Venlafaxine hcl) ..... Once daily  Complete Medication List: 1)  Lorazepam 0.5 Mg Tabs (Lorazepam) .... Three times a day as needed  anxiety 2)  Pulmicort Flexhaler 90 Mcg/act Aepb (Budesonide) .... As needed 3)  Promethazine Hcl 25 Mg Tabs (Promethazine hcl) .Marland Kitchen.. 1 every 4 hours as needed nausea 4)  Ketorolac Tromethamine 10 Mg Tabs (Ketorolac tromethamine) .... Three times a day as needed for  ha 5)  Treximet 85-500 Mg Tabs (Sumatriptan-naproxen sodium) .... As needed for ha 6)  Xyzal 5 Mg Tabs (Levocetirizine dihydrochloride) .... Once daily 7)  Flonase 50 Mcg/act Susp (Fluticasone propionate) .... 2 sprays each nostril once daily 8)  Lunesta 3 Mg Tabs (Eszopiclone) .... At bedtime 9)  Nexium 40 Mg Cpdr (Esomeprazole magnesium) .... Once daily 10)  Venlafaxine Hcl 75 Mg Xr24h-cap (Venlafaxine hcl) .... Once daily 11)  Depakote 125 Mg Tbec (Divalproex sodium) .... Once daily  Patient Instructions: 1)  Given injections of Toradol, Demerol, and Phanergan. She will leave work today and have her husband drive her home. She will start back on Effexor daily with occasional Lorazepam. Try Depakote daily for the migraines.  2)  Please schedule a follow-up appointment in 2 weeks.  Prescriptions: TREXIMET 85-500 MG TABS (SUMATRIPTAN-NAPROXEN SODIUM) as needed for HA  #12 x 11   Entered and Authorized by:   Nelwyn Salisbury MD   Signed by:   Nelwyn Salisbury MD on 11/04/2009   Method used:   Electronically to        [facility]* (retail)       [address]       [address] Shipping/mailing       [place], [place]  [address]       Ph: [phone]       Fax: [phone]   RxID:   [id number] DEPAKOTE 125 MG TBEC (DIVALPROEX SODIUM) once daily  #30 x 5   Entered and Authorized by:   Nelwyn Salisbury MD   Signed by:   Nelwyn Salisbury MD on 11/04/2009   Method used:   Electronically to        [address] [phone]* (retail)       [address]       [place], [place]  [address]       Ph: [phone]       Fax: [phone]   RxID:   [id number] VENLAFAXINE HCL 75 MG XR24H-CAP (VENLAFAXINE HCL) once daily  #30 x 5   Entered and Authorized by:   Nelwyn Salisbury MD   Signed by:   Nelwyn Salisbury MD on 11/04/2009   Method used:   Electronically to        [address] [phone]* (retail)       [address]       [place], [place]  [address]       Ph:  [phone]       Fax: [phone]   RxID:   [id number]    Medication Administration  Injection # 1:    Medication: Ketorolac-Toradol 15mg     Diagnosis: MIGRAINE HEADACHE (ICD-346.90)    Route: IM    Site: RUOQ gluteus    Exp Date: 03/19/2911    Lot #: [id number]    Mfr: novaplus     Comments: 60 mg given     Patient tolerated injection without complications    Given by: Pura Spice, RN (November 04, 2009 9:51 AM)  Injection # 3:    Medication: Demerol  100mg   Injection    Diagnosis: MIGRAINE HEADACHE (ICD-346.90)    Route: IM  Site: LUOQ gluteus    Exp Date: 11/19/2010    Lot #: [id number]    Mfr: hospira    Comments: 50  mg given     Given by: Pura Spice, RN (November 04, 2009 11:02 AM)  Injection # 4:    Medication: Promethazine up to 50mg     Diagnosis: MIGRAINE HEADACHE (ICD-346.90)    Route: IM    Site: LUOQ gluteus    Exp Date: 08/2011    Lot #: [id number]    Mfr: [email or web address]    Patient tolerated injection without complications    Given by: Pura Spice, RN (November 04, 2009 11:04 AM)  Orders Added: 1)  Ketorolac-Toradol 15mg  [J1885] 2)  Admin of Therapeutic Inj  intramuscular or subcutaneous [96372] 3)  Est. Patient Level IV [78295] 4)  Demerol  100mg   Injection [J2175] 5)  Promethazine up to 50mg  [J2550] 6)  Admin of Therapeutic Inj  intramuscular or subcutaneous [62130]

## 2010-02-17 NOTE — Progress Notes (Signed)
Summary: antibotic or fever  Phone Note Call from Patient   Caller: Patient Call For: Kingsly Kloepfer Summary of Call: chest is getting tight, no fever, cough x2 wks [facility] in [place] or ov? Initial call taken by: Alfred Levins, CMA,  January 22, 2009 1:41 PM  Follow-up for Phone Call        try a Zpack and see me if your chest gets any tighter Follow-up by: Nelwyn Salisbury MD,  January 22, 2009 2:28 PM  Additional Follow-up for Phone Call Additional follow up Details #1::        pt. is aware Additional Follow-up by: Nelwyn Salisbury MD,  January 22, 2009 2:28 PM    New/Updated Medications: ZITHROMAX Z-PAK 250 MG TABS (AZITHROMYCIN) as directed Prescriptions: ZITHROMAX Z-PAK 250 MG TABS (AZITHROMYCIN) as directed  #1 x 0   Entered and Authorized by:   Nelwyn Salisbury MD   Signed by:   Nelwyn Salisbury MD on 01/22/2009   Method used:   Electronically to        [address] [phone]* (retail)       [address]       [place], [place]  [address]       Ph: [phone]       Fax: [phone]   RxID:   [id number]

## 2010-02-19 NOTE — Assessment & Plan Note (Signed)
Summary: pneumonia shot//ccm   Nurse Visit   Allergies: 1)  ! * Topamax 2)  Sulfamethoxazole (Sulfamethoxazole) 3)  Avelox (Moxifloxacin Hcl) 4)  Codeine Phosphate (Codeine Phosphate)  Immunizations Administered:  Pneumonia Vaccine:    Vaccine Type: Pneumovax    Site: right deltoid    Mfr: Merck    Dose: 0.5 ml    Route: IM    Given by: Alfred Levins, CMA    Exp. Date: 04/02/2011    Lot #: [id number]  Orders Added: 1)  Pneumococcal Vaccine [90732] 2)  Admin 1st Vaccine [04540]

## 2010-02-19 NOTE — Progress Notes (Signed)
Summary: 90 day supply   Phone Note From Pharmacy   Caller: Patient Caller: [place]  fax [phone] Summary of Call: requesting 90 day supply venlafaxine ER 75 mg  and divalproex DR 250 mg   Initial call taken by: Pura Spice, RN,  January 27, 2010 9:01 AM  Follow-up for Phone Call        call in #90 with 3 rf of both  Follow-up by: Nelwyn Salisbury MD,  January 27, 2010 1:55 PM  Additional Follow-up for Phone Call Additional follow up Details #1::        done  Additional Follow-up by: Pura Spice, RN,  January 27, 2010 4:00 PM    Prescriptions: DEPAKOTE 250 MG TBEC (DIVALPROEX SODIUM) once daily  #90 x 3   Entered by:   Pura Spice, RN   Authorized by:   Nelwyn Salisbury MD   Signed by:   Pura Spice, RN on 01/27/2010   Method used:   Electronically to        [facility]* (retail)       [address]       [address] Shipping/mailing       [place], [place]  [address]       Ph: [phone]       Fax: [phone]   RxID:   [id number] VENLAFAXINE HCL 75 MG XR24H-CAP (VENLAFAXINE HCL) once daily  #90 x 3   Entered by:   Pura Spice, RN   Authorized by:   Nelwyn Salisbury MD   Signed by:   Pura Spice, RN on 01/27/2010   Method used:   Electronically to        [facility]* (retail)       [address]       [address] Shipping/mailing       [place], [place]  [address]       Ph: [phone]       Fax: [phone]   RxID:   [id number]

## 2010-02-19 NOTE — Progress Notes (Signed)
Summary: refills and ? increase of depakote  Phone Note Call from Patient Call back at Home Phone [phone]   Caller: Patient Summary of Call: Effexor is doing great. I do need a refill sent to [facility] for a 90 days supply. As far as the depakote, it is helping but I'm still having some bad ha's. I had one yesterday and had to take a toradol for it. It helped some but sleep and a dark room made me feel better. So I was wondering if I need to take 2 of the depakote a day. I need a refill for this as well sent to [facility]. 90 days supply on this as well. Initial call taken by: Romualdo Bolk, CMA Duncan Dull),  December 29, 2009 3:35 PM  Follow-up for Phone Call        we will increase the Depakote to 250 mg a day  Follow-up by: Nelwyn Salisbury MD,  December 29, 2009 5:13 PM    New/Updated Medications: VENLAFAXINE HCL 75 MG XR24H-CAP (VENLAFAXINE HCL) once daily DEPAKOTE 250 MG TBEC (DIVALPROEX SODIUM) once daily Prescriptions: DEPAKOTE 250 MG TBEC (DIVALPROEX SODIUM) once daily  #30 x 11   Entered and Authorized by:   Nelwyn Salisbury MD   Signed by:   Nelwyn Salisbury MD on 12/29/2009   Method used:   Electronically to        [facility]* (retail)       [address]       [address] Shipping/mailing       [place], [place]  [address]       Ph: [phone]       Fax: [phone]   RxID:   [id number] VENLAFAXINE HCL 75 MG XR24H-CAP (VENLAFAXINE HCL) once daily  #30 x 11   Entered and Authorized by:   Nelwyn Salisbury MD   Signed by:   Nelwyn Salisbury MD on 12/29/2009   Method used:   Electronically to        [facility]* (retail)       [address]       [address] Shipping/mailing       [place], [place]  [address]       Ph: [phone]       Fax: [phone]   RxID:   [id number]

## 2010-03-23 ENCOUNTER — Telehealth: Payer: Self-pay | Admitting: *Deleted

## 2010-03-23 MED ORDER — DIVALPROEX SODIUM 500 MG PO DR TAB: 500.0000 mg | DELAYED_RELEASE_TABLET | Freq: Every day | ORAL | Status: DC

## 2010-03-23 MED ORDER — VENLAFAXINE HCL ER 150 MG PO TB24: 150.0000 mg | ORAL_TABLET | Freq: Every day | ORAL | Status: DC

## 2010-03-23 NOTE — Telephone Encounter (Signed)
Refill for depokate and effexor two daily to  pharmacy x 90 days

## 2010-04-29 ENCOUNTER — Encounter: Payer: Self-pay | Admitting: Family Medicine

## 2010-06-05 NOTE — Assessment & Plan Note (Signed)
[facility]                               PULMONARY OFFICE NOTE   BRINKLEY, PEET                   MRN:          [id number]  DATE:11/18/2005                            DOB:          07-Feb-1975    PULMONARY/ALLERGY CONSULT:   PROBLEM:  Allergy consultation at the kind request of Dr. Clent Ridges for this 35-  year-old woman.   HISTORY:  Ten years ago she says she had mildly positive skin test  reactions, and was not put on allergy vaccine.  Over the years she has had  intermittent sneezing and nasal congestion, worst in the spring and fall,  such that her husband does the house cleaning so she can avoid stirring up  the dust.  She wakes occasionally with eyes itching and some discharge.  They got a new dog in March, and she is not sure if that has made a  difference.  She has used QVAR p.r.n. as a rescue inhaler for cough.  She  has tried Benadryl, and has used small amounts of Singulair and Claritin.  She manages everything on a p.r.n. basis, and is not clear how much  difference any one treatment makes.  She feels better at work than she does  at home.  Her biggest concern has been watery eyes, cough and head  congestion over the past 6 months, worst in the mornings and relieved by  Benadryl.   MEDICATIONS:  1. Cymbalta 30 mg b.i.d.  2. Singulair 10 mg.  3. QVAR uncertain strength, used p.r.n.  4. Protonix.   ALLERGIES:  Drug intolerance to SULFA, with GI upset.  She feels faint and  flushed after a CORTISONE INJECTION, GI upset with AVELOX and itching from  CODEINE.   REVIEW OF SYSTEMS:  Cough without shortness of breath or significant wheeze.  Acid indigestion, relieved by Protonix.  Snoring, headaches, nasal  congestion and difficulty breathing through nose, sneezing and itching.  Anxiety and depression.   PAST HISTORY:  1. Frequent episodes of bronchitis in the past.  2. She was hospitalized four days for pneumonia in 2005.  Never  really      formally diagnosed with asthma.  3. Tonsils and adenoids removed in childhood.  4. Myringotomy tubes.  5. Elevated cholesterol.  6. 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  7. Irritable bowel syndrome.  8. No intolerance to latex, contrast or iodine, or aspirin.   SOCIAL HISTORY:  Never smoked.  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 Clent Ridges is  her primary physician.  Married, no children (husband's first name is also  Radiation protection practitioner).  She is a Scientist, forensic.  They are nonsmokers,  living in a house new to them since 2005.  They have 2 dogs, no significant  mold or unusual dust problems.  Sleeping on a pillow-top, foam-fill bed  without feather exposure.   FAMILY HISTORY:  Several with asthma, allergies, emphysema, heart disease, a  clotting problem attributed to hormone therapy, rheumatism and a grandfather  who had cancer.  A grandmother  had hay fever.   ADDITIONAL ALLERGY ISSUES:  No history of urticaria, unusual reaction to  insects or to foods, to cats, grass or leaves.   OBJECTIVE:  Weight 199 pounds, BP 128/88, pulse regular 75, room air  saturation 98%.  Pleasant young woman.  SKIN:  No obvious rash.  ADENOPATHY:  None at the neck, shoulders or axillae.  HEENT:  Soft contact lenses (wears them 6 a.m. to 8 p.m., changes once a  month and takes out at night).  Conjunctivae are not injected.  Ears are  clear.  I questioned a slight inferior scarring left tympanic membrane, but  I think it is symmetrical and probably normal for her.  Nasal airway shows  clear mucus, but not obstructed and no polyps.  Pharynx is clear with no  postnasal drainage or inflammation.  Voice quality is normal.  CHEST:  Quiet, clear, unlabored breathing.  Heart sounds are regular without murmur or gallop.  EXTREMITIES:  Without cyanosis, clubbing or edema.   IMPRESSION:  1. Allergic conjunctivitis with question of whether her contact lenses  are      causing some of the watering and tearing that she notices, and also      whether her contact lenses will permit use of ophthalmic medications.  2. Allergic rhinitis.  3. Possible minimal intermittent asthma.   PLAN:  1. Sample of either Pataday or Optivar, 1 drop each eye b.i.d. if okay      with her eye doctor.  2. She is going to try just leaving her contact lenses out for a week and      wearing her glasses before she tried medication, to see what difference      that makes in watering and tearing.  3. Sample Astelin 1 spray each nostril b.i.d. p.r.n.  4. Since Dr. Clent Ridges can refill these medications if helpful.  I have offered      to see her again p.r.n., anticipating that if there are continued      problems, allergy skin testing may become useful at that point.  I      appreciate the chance to see her.    Clinton D. Maple Hudson, MD, Tonny Bollman, FACP  Electronically Signed   CDY/MedQ  DD: 11/20/2005  DT: 11/21/2005  Job #: [id number]   cc:   Jeannett Senior A. Clent Ridges, MD

## 2010-06-05 NOTE — Discharge Summary (Signed)
NAMETAKILA, KRONBERG                        ACCOUNT NO.:  [id number]   MEDICAL RECORD NO.:  [id number]                   PATIENT TYPE:  INP   LOCATION:  5709                                 FACILITY:  [facility]   PHYSICIAN:  Titus Dubin. Alwyn Ren, M.D. [facility]         DATE OF BIRTH:  [birth year]   DATE OF ADMISSION:  04/28/2003  DATE OF DISCHARGE:  05/02/2003                                 DISCHARGE SUMMARY   ADMITTING DIAGNOSIS:  Bilateral community-acquired pneumonia.   DISCHARGE DIAGNOSES:  1. Bilateral community-acquired pneumonia.  2. Hypokalemia.  3. Elevated hepatic enzymes, improving.   HISTORY AND PHYSICAL:  Ms. Mishkin is a 35 year old white female with a 1-  week history of fever and myalgias for which Flumadine was prescribed.  Subsequently, she began to have sweats and cough.  Omnicef was initiated 3  days prior to hospitalization.  She was seen at the urgent care the morning  of admission to the hospital, nearly prostrate, with shortness of breath and  cough.  X-rays showed bilateral infiltrates.   PAST MEDICAL HISTORY:  Past medical history includes migraines and  hyperlipidemia.   MEDICATIONS:  She is on Zetia, Lipitor, Mircette, Depakote and Omnicef.   HABITS:  She is a nonsmoker.   ALLERGIES:  She is allergic to SULFA and PENICILLIN.   HOSPITAL COURSE:  She was admitted and begun on Avelox 400 mg IV, following  collection of cultures.   Despite the Avelox, she continued to have somewhat of a hectic fever pattern  with temperature up to 102.1 on April 28, 2003.  On May 01, 2003,  temperature had been 101.  Followup chest x-ray had shown no improvement,  although clinically she was having a more productive cough and clinically  was improved.   Consultation was obtained with Infectious Disease, Dr. Rockey Situ. Roxan Hockey.   It was his opinion that Avelox would be an appropriate therapy for this  community-acquired pneumonia.  He doubted any esoteric or unusual  etiology.   On the morning of discharge, she was essentially asymptomatic; temperature  over the 24 hours prior to discharge had been no higher than 99.  She  exhibited a resting tachycardia and respiratory rate of approximately 24.  Chest revealed only mild rales with no increased work of breathing.  O2  saturations were 94% on room air.   Repeat potassium was 3.2, prompting potassium supplement as an outpatient.  She was on no diuretics at the time of admission.  Glucose was also mildly  elevated at 120.  Her white count was no higher than 9000.  She exhibited no  anemia.  At the time of admission, potassium was as low as 2.5 and as  stated, was 3.2 at time of discharge.   Her admitting AST was 74 and ALT was 72.  Her statin was held.  On April 30, 2003, the AST was 54 and the ALT 51.  It is recommended that the  statin be  held as an outpatient with followup fasting liver functions.   TSH was normal.  HIV and urine pregnancy test were normal or negative.  The  urinalysis revealed rare bacteria with trace leukocyte esterase.  Blood  culture revealed no growth and sputum culture revealed normal organisms.   Followup chest x-ray revealed patchy bilateral areas of pneumonia which  persisted but with some decrease in the left upper lobe.   She was discharged on her home medications with the exception of the  Lipitor.  Potassium chloride 20 mEq daily will be prescribed.  She will  continue the Avelox for 7 days with a followup chest x-ray by Dr. Tera Mater.  Fry at that point.   Because of the mildly elevated glucose and the liver enzyme elevation, it is  recommended that she restrict carbohydrates, such as [employer].  It is to be noted that she is a nonsmoker, but is on birth control  pills.  Homans sign was negative and the clinical picture was of community-  acquired pneumonia with bilateral infiltrates and purulent sputum.   Her discharge status is improved,  prognosis is good.   DIET:  Diet is as noted.   ACTIVITY:  Activities will be as tolerated.   FOLLOWUP:  She will be asked to see Dr. Clent Ridges on May 09, 2003 for office  visit and chest x-ray.  She is to call sooner should she have increasing  shortness of breath, chest pain or exacerbation of the pneumonic or  bronchitic symptoms.                                                Titus Dubin. Alwyn Ren, M.D. [facility]    [facility]/MEDQ  D:  05/02/2003  T:  05/03/2003  Job:  [id number]   cc:   Jeannett Senior A. Clent Ridges, M.D. [facility]

## 2010-06-05 NOTE — H&P (Signed)
NAMEDENEE, BOEDER NO.:  [id number]   MEDICAL RECORD NO.:  [id number]                   PATIENT TYPE:  INP   LOCATION:  5709                                 FACILITY:  [facility]   PHYSICIAN:  Corwin Levins, M.D. [facility]             DATE OF BIRTH:  1975-12-19   DATE OF ADMISSION:  04/28/2003  DATE OF DISCHARGE:                                HISTORY & PHYSICAL   CHIEF COMPLAINT:  Worsening shortness of breath with cough and prostration.   HISTORY OF PRESENT ILLNESS:  Dawn Jensen is a 35 year old white female with  one-week history of fever and myalgias not better with Flumadine.  Later in  the week she started with increasing sweats and cough.  No real improvement  with Omnicef for the last three days.  She was seen at urgent care today  near prostrate with shortness of breath and cough and chest x-ray apparently  showing bilateral infiltrates per the attending physician there.  She is now  for admission.  She was given a shot of Rocephin at the urgent care prior to  transfer for direct admission.   PAST MEDICAL HISTORY:  1. Migraines.  2. Obesity.  3. Hypercholesterolemia.   PAST SURGICAL HISTORY:  Status post T&A.   ALLERGIES:  1. SULFA.  2. PREDNISONE.   CURRENT MEDICATIONS:  1. Omnicef 300 mg b.i.d.  2. Depakote 500 mg four q.h.s.  3. Mircette BCP.  4. Lipitor 20 mg daily.  5. Zetia 10 mg.   SOCIAL HISTORY:  No tobacco.  No alcohol.  Married.  [place]   FAMILY HISTORY:  Significant for diabetes, hypothyroidism, glaucoma, DJD,  and brain cancer.   REVIEW OF SYSTEMS:  Otherwise noncontributory.   PHYSICAL EXAMINATION:  GENERAL APPEARANCE:  Dawn Jensen is a 35 year old  white female.  Appearing moderately ill, but nontoxic.  VITAL SIGNS:  Temperature 102.1 degrees, heart rate 111, respirations 24,  blood pressure 139/93.  HEENT:  She is flushed.  Sclerae clear.  TMs with mild erythema.  Pharynx  with  marked erythema.  NECK:  Without lymphadenopathy, JVD, or thyromegaly.  CHEST:  Decreased breath sounds and left lower lobe rales.  In particular,  it is hard to hear anything on the right.  CARDIAC:  Regular rate and rhythm.  Otherwise mildly tachycardic.  ABDOMEN:  Soft and nontender.  Positive bowel sounds.  No organomegaly or  masses.  EXTREMITIES:  No edema.   LABORATORIES:  Chest x-ray with bilateral infiltrates per report only.  Film  not available at this time.   ASSESSMENT AND PLAN:  1. Pneumonia, bilateral, questionable post viral with questionable     staphylococcal superinfection.  She is to be admitted and given IV fluids     and oxygen.  Will check blood and sputum cultures and routine     laboratories, as well as urine pregnancy test because if negative can  start Avelox IV for community-acquired pneumonia with questionable     Staphylococcus.  2. Other medical problems.  Continue home medication as above.                                                Corwin Levins, M.D. [facility]    JWJ/MEDQ  D:  04/28/2003  T:  04/29/2003  Job:  [id number]   cc:   Jeannett Senior A. Clent Ridges, M.D. [facility]

## 2010-06-19 ENCOUNTER — Telehealth: Payer: Self-pay | Admitting: *Deleted

## 2010-06-19 MED ORDER — HYDROCODONE-ACETAMINOPHEN 5-325 MG PO TABS: 1.0000 | ORAL_TABLET | Freq: Four times a day (QID) | ORAL | Status: AC | PRN

## 2010-06-19 NOTE — Telephone Encounter (Signed)
patient  Has a sever headache last night left side of head, down the neck, and down to the shoulder.  Tried a hydrocodone for pain with some relief.  Any suggestions? Taking depakote 500 qhs and effexor 300mg .

## 2010-06-19 NOTE — Telephone Encounter (Signed)
She needs refills on pain meds for her HAs

## 2010-06-19 NOTE — Telephone Encounter (Signed)
done

## 2010-06-22 ENCOUNTER — Other Ambulatory Visit: Payer: Self-pay

## 2010-06-22 MED ORDER — VENLAFAXINE HCL ER 150 MG PO CP24: 150.0000 mg | ORAL_CAPSULE | Freq: Two times a day (BID) | ORAL | Status: DC

## 2010-06-25 ENCOUNTER — Telehealth: Payer: Self-pay | Admitting: Family Medicine

## 2010-06-25 NOTE — Telephone Encounter (Signed)
We have been dealing with frequent HAs, probably migraines. They are getting worse and now occur every day. They always start on the left side of her head. We agreed to set up a CT scan to rule out structural causes

## 2010-07-01 ENCOUNTER — Ambulatory Visit (INDEPENDENT_AMBULATORY_CARE_PROVIDER_SITE_OTHER)
Admission: RE | Admit: 2010-07-01 | Discharge: 2010-07-01 | Disposition: A | Discharge status: Home / Self Care | Payer: 59 | Source: Ambulatory Visit | Attending: Family Medicine | Admitting: Family Medicine

## 2010-07-01 DIAGNOSIS — R51 Headache: Secondary | ICD-10-CM

## 2010-07-07 ENCOUNTER — Telehealth: Payer: Self-pay | Admitting: *Deleted

## 2010-07-07 NOTE — Telephone Encounter (Signed)
Patient would like to know if she can increase her Depakote to 1000 mg?

## 2010-07-08 ENCOUNTER — Telehealth: Payer: Self-pay | Admitting: Family Medicine

## 2010-07-08 DIAGNOSIS — R51 Headache: Secondary | ICD-10-CM

## 2010-07-08 NOTE — Telephone Encounter (Signed)
Her HAs are getting worse. Often wakes up with a HA. I told her to take 2 Depakotes at bedtime ( for a total of 1000 mg) and add a Flexeril tablet at bedtime. Will refer her to Neurology also

## 2010-07-08 NOTE — Telephone Encounter (Signed)
Yes I discussed this with her

## 2010-08-10 ENCOUNTER — Telehealth: Payer: Self-pay

## 2010-08-10 NOTE — Telephone Encounter (Signed)
Patient states she needs flexeril 90 day supply called in to [facility] if Dr. Clent Ridges would still like her to continue the medication.  Please advise

## 2010-08-10 NOTE — Telephone Encounter (Signed)
Since it is not helping, just stop it

## 2010-08-11 NOTE — Telephone Encounter (Signed)
Pt aware.

## 2010-08-14 ENCOUNTER — Ambulatory Visit (INDEPENDENT_AMBULATORY_CARE_PROVIDER_SITE_OTHER): Payer: 59 | Admitting: Family Medicine

## 2010-08-14 ENCOUNTER — Encounter: Payer: Self-pay | Admitting: Family Medicine

## 2010-08-14 DIAGNOSIS — G43909 Migraine, unspecified, not intractable, without status migrainosus: Secondary | ICD-10-CM

## 2010-08-14 MED ORDER — MEPERIDINE HCL 100 MG/ML IJ SOLN
100.0000 mg | Freq: Once | INTRAMUSCULAR | Status: AC
  Administered 2010-08-14: 100 mg via INTRAMUSCULAR

## 2010-08-14 MED ORDER — MEPERIDINE HCL 100 MG/ML IJ SOLN
100.0000 mg | Freq: Once | INTRAMUSCULAR | Status: DC
  Administered 2010-08-14: 100 mg via INTRAMUSCULAR

## 2010-08-14 MED ORDER — PROMETHAZINE HCL 50 MG/ML IJ SOLN
50.0000 mg | INTRAMUSCULAR | Status: AC
  Administered 2010-08-14: 50 mg via INTRAMUSCULAR

## 2010-08-14 MED ORDER — METHYLPREDNISOLONE ACETATE 80 MG/ML IJ SUSP
120.0000 mg | Freq: Once | INTRAMUSCULAR | Status: AC
  Administered 2010-08-14: 120 mg via INTRAMUSCULAR

## 2010-08-14 MED ORDER — METHYLPREDNISOLONE ACETATE 80 MG/ML IJ SUSP: 120.0000 mg | Freq: Once | INTRAMUSCULAR | Status: DC

## 2010-08-14 NOTE — Progress Notes (Signed)
  Subjective:    Patient ID: Dawn Jensen, female    DOB: 1975/03/18, 35 y.o.   MRN: [id number]  HPI Here with her mother for a migraine HA that started yesterday. She put up with it all day yesterday without taking any meds, then she woke up this morning with the worst HA yet. She has vomited a few times. She took a Medical sales representative and a Vicodin this morning but then threw this back up again.    Review of Systems  Constitutional: Negative.   Respiratory: Negative.   Cardiovascular: Negative.   Gastrointestinal: Positive for nausea and vomiting.  Neurological: Positive for headaches.       Objective:   Physical Exam  Constitutional: She is oriented to person, place, and time.       In pain, photophobic   HENT:  Head: Normocephalic and atraumatic.  Eyes: Conjunctivae are normal. Pupils are equal, round, and reactive to light.  Neck: Normal range of motion. Neck supple. No thyromegaly present.  Lymphadenopathy:    She has no cervical adenopathy.  Neurological: She is alert and oriented to person, place, and time. She exhibits normal muscle tone. Coordination normal.          Assessment & Plan:  She was given injections today, and her mother will drive her home. Out of work today. We will refer her to Hali Marry NP at [facility] in [facility].

## 2010-08-19 ENCOUNTER — Telehealth: Payer: Self-pay | Admitting: Family Medicine

## 2010-08-19 MED ORDER — DIVALPROEX SODIUM 500 MG PO DR TAB: 1000.0000 mg | DELAYED_RELEASE_TABLET | Freq: Every day | ORAL | Status: DC

## 2010-08-19 NOTE — Telephone Encounter (Signed)
Refill request for Depakote 500 mg and send to [facility].

## 2010-08-19 NOTE — Telephone Encounter (Signed)
done

## 2010-09-23 ENCOUNTER — Other Ambulatory Visit: Payer: Self-pay | Admitting: Unknown Physician Specialty

## 2010-09-23 DIAGNOSIS — R51 Headache: Secondary | ICD-10-CM

## 2010-09-27 ENCOUNTER — Other Ambulatory Visit: Payer: 59

## 2010-09-27 ENCOUNTER — Ambulatory Visit
Admission: RE | Admit: 2010-09-27 | Discharge: 2010-09-27 | Disposition: A | Discharge status: Home / Self Care | Payer: 59 | Source: Ambulatory Visit | Attending: Unknown Physician Specialty | Admitting: Unknown Physician Specialty

## 2010-09-27 DIAGNOSIS — R51 Headache: Secondary | ICD-10-CM

## 2010-10-02 ENCOUNTER — Ambulatory Visit (INDEPENDENT_AMBULATORY_CARE_PROVIDER_SITE_OTHER): Payer: 59 | Admitting: Family Medicine

## 2010-10-02 ENCOUNTER — Encounter: Payer: Self-pay | Admitting: Family Medicine

## 2010-10-02 VITALS — BP 126/90 | HR 101 | Temp 99.1°F

## 2010-10-02 DIAGNOSIS — R51 Headache: Secondary | ICD-10-CM

## 2010-10-02 MED ORDER — PROMETHAZINE HCL 50 MG/ML IJ SOLN
50.0000 mg | Freq: Four times a day (QID) | INTRAMUSCULAR | Status: DC | PRN
  Administered 2010-10-02: 50 mg via INTRAMUSCULAR

## 2010-10-02 MED ORDER — PROMETHAZINE HCL 50 MG/ML IJ SOLN: 50.0000 mg | Freq: Four times a day (QID) | INTRAMUSCULAR | Status: DC | PRN

## 2010-10-02 MED ORDER — MEPERIDINE HCL 100 MG/ML IJ SOLN
100.0000 mg | Freq: Once | INTRAMUSCULAR | Status: AC
  Administered 2010-10-02: 100 mg via INTRAMUSCULAR

## 2010-10-02 MED ORDER — METHYLPREDNISOLONE ACETATE 80 MG/ML IJ SUSP
120.0000 mg | Freq: Once | INTRAMUSCULAR | Status: AC
  Administered 2010-10-02: 120 mg via INTRAMUSCULAR

## 2010-10-02 NOTE — Progress Notes (Signed)
Addended by: Aniceto Boss A on: 10/02/2010 12:18 PM   Modules accepted: Orders

## 2010-10-02 NOTE — Progress Notes (Signed)
  Subjective:    Patient ID: Dawn Jensen, female    DOB: 12-21-1975, 35 y.o.   MRN: [id number]  HPI Here with a bad migraine that woke her up from sleep at 5:30 this morning. She has a typical throbbing HA across the top of the head with nausea and light sensitivity. She has taken a Frova and a Maxalt today with no improvement. She is seeing Hali Marry for [employer].    Review of Systems  Constitutional: Negative.   Gastrointestinal: Positive for nausea. Negative for vomiting.  Neurological: Positive for headaches.       Objective:   Physical Exam  Constitutional: She is oriented to person, place, and time.       On pain, quite photophobic   HENT:  Head: Normocephalic and atraumatic.  Eyes: Conjunctivae and EOM are normal. Pupils are equal, round, and reactive to light.  Neck: Neck supple. No thyromegaly present.  Lymphadenopathy:    She has no cervical adenopathy.  Neurological: She is alert and oriented to person, place, and time. She has normal reflexes. No cranial nerve deficit. She exhibits normal muscle tone. Coordination normal.          Assessment & Plan:  Typical migraine. She is given injections as above, and she has called her mother to come and drive her home. Follow up with Neurology as planned

## 2010-10-15 ENCOUNTER — Ambulatory Visit: Payer: 59

## 2010-11-16 ENCOUNTER — Telehealth: Payer: Self-pay

## 2010-11-16 NOTE — Telephone Encounter (Signed)
Pt states she is having bad migraines again and chronic fatigue.  Pt states she is having daily headaches mostly on a scale of 10, some were a 3.  Pls advise.

## 2010-11-16 NOTE — Telephone Encounter (Signed)
She is set up for a cpx with labs in 2 weeks. We will  check some things then

## 2010-11-23 ENCOUNTER — Ambulatory Visit (INDEPENDENT_AMBULATORY_CARE_PROVIDER_SITE_OTHER): Payer: 59 | Admitting: Family Medicine

## 2010-11-23 DIAGNOSIS — G43909 Migraine, unspecified, not intractable, without status migrainosus: Secondary | ICD-10-CM

## 2010-11-23 MED ORDER — KETOROLAC TROMETHAMINE 60 MG/2ML IM SOLN
60.0000 mg | Freq: Once | INTRAMUSCULAR | Status: AC
  Administered 2010-11-23: 60 mg via INTRAMUSCULAR

## 2010-11-27 ENCOUNTER — Ambulatory Visit: Payer: 59 | Admitting: Family Medicine

## 2010-11-27 ENCOUNTER — Other Ambulatory Visit (INDEPENDENT_AMBULATORY_CARE_PROVIDER_SITE_OTHER): Payer: 59

## 2010-11-27 DIAGNOSIS — Z309 Encounter for contraceptive management, unspecified: Secondary | ICD-10-CM

## 2010-11-27 DIAGNOSIS — Z0000 Encounter for general adult medical examination without abnormal findings: Secondary | ICD-10-CM

## 2010-11-27 LAB — BASIC METABOLIC PANEL
BUN: 10 mg/dL (ref 6–23)
Chloride: 112 mEq/L (ref 96–112)
Potassium: 3.6 mEq/L (ref 3.5–5.1)

## 2010-11-27 LAB — HEPATIC FUNCTION PANEL
ALT: 29 U/L (ref 0–35)
AST: 21 U/L (ref 0–37)
Albumin: 3.9 g/dL (ref 3.5–5.2)

## 2010-11-27 LAB — CBC WITH DIFFERENTIAL/PLATELET
Basophils Relative: 0.3 % (ref 0.0–3.0)
Eosinophils Relative: 0.5 % (ref 0.0–5.0)
HCT: 45.2 % (ref 36.0–46.0)
Lymphs Abs: 1.9 10*3/uL (ref 0.7–4.0)
MCV: 90.5 fl (ref 78.0–100.0)
Monocytes Absolute: 0.3 10*3/uL (ref 0.1–1.0)
RBC: 5 Mil/uL (ref 3.87–5.11)
WBC: 4.7 10*3/uL (ref 4.5–10.5)

## 2010-11-27 LAB — LIPID PANEL: Cholesterol: 239 mg/dL — ABNORMAL HIGH (ref 0–200)

## 2010-11-27 MED ORDER — MEDROXYPROGESTERONE ACETATE 150 MG/ML IM SUSP
150.0000 mg | Freq: Once | INTRAMUSCULAR | Status: AC
  Administered 2010-11-27: 150 mg via INTRAMUSCULAR

## 2010-11-27 NOTE — Progress Notes (Signed)
Addended by: Aniceto Boss A on: 11/27/2010 01:27 PM   Modules accepted: Orders

## 2010-12-01 ENCOUNTER — Telehealth: Payer: Self-pay | Admitting: Family Medicine

## 2010-12-01 ENCOUNTER — Ambulatory Visit (INDEPENDENT_AMBULATORY_CARE_PROVIDER_SITE_OTHER): Payer: 59 | Admitting: Family Medicine

## 2010-12-01 DIAGNOSIS — E539 Vitamin B deficiency, unspecified: Secondary | ICD-10-CM

## 2010-12-01 MED ORDER — CYANOCOBALAMIN 1000 MCG/ML IJ SOLN
1000.0000 ug | Freq: Once | INTRAMUSCULAR | Status: AC
  Administered 2010-12-01: 1000 ug via INTRAMUSCULAR

## 2010-12-01 MED ORDER — ASPIRIN EC 81 MG PO TBEC: 81.0000 mg | DELAYED_RELEASE_TABLET | Freq: Every day | ORAL | Status: DC

## 2010-12-01 MED ORDER — ATORVASTATIN CALCIUM 20 MG PO TABS: 20.0000 mg | ORAL_TABLET | Freq: Every day | ORAL | Status: DC

## 2010-12-01 NOTE — Telephone Encounter (Signed)
Per her recent lab results

## 2010-12-02 ENCOUNTER — Ambulatory Visit: Payer: 59 | Admitting: Family Medicine

## 2010-12-02 ENCOUNTER — Other Ambulatory Visit: Payer: Self-pay | Admitting: Family Medicine

## 2010-12-08 ENCOUNTER — Ambulatory Visit (INDEPENDENT_AMBULATORY_CARE_PROVIDER_SITE_OTHER): Payer: 59 | Admitting: Family Medicine

## 2010-12-08 DIAGNOSIS — E539 Vitamin B deficiency, unspecified: Secondary | ICD-10-CM

## 2010-12-08 MED ORDER — CYANOCOBALAMIN 1000 MCG/ML IJ SOLN
1000.0000 ug | Freq: Once | INTRAMUSCULAR | Status: AC
  Administered 2010-12-08: 1000 ug via INTRAMUSCULAR

## 2010-12-15 ENCOUNTER — Ambulatory Visit (INDEPENDENT_AMBULATORY_CARE_PROVIDER_SITE_OTHER): Payer: 59 | Admitting: Family Medicine

## 2010-12-15 DIAGNOSIS — E539 Vitamin B deficiency, unspecified: Secondary | ICD-10-CM

## 2010-12-15 MED ORDER — CYANOCOBALAMIN 1000 MCG/ML IJ SOLN
1000.0000 ug | Freq: Once | INTRAMUSCULAR | Status: AC
  Administered 2010-12-15: 1000 ug via INTRAMUSCULAR

## 2010-12-16 ENCOUNTER — Ambulatory Visit: Payer: 59 | Admitting: Family Medicine

## 2010-12-16 DIAGNOSIS — R51 Headache: Secondary | ICD-10-CM

## 2010-12-16 MED ORDER — KETOROLAC TROMETHAMINE 60 MG/2ML IM SOLN
60.0000 mg | Freq: Once | INTRAMUSCULAR | Status: AC
  Administered 2010-12-16: 60 mg via INTRAMUSCULAR

## 2010-12-21 ENCOUNTER — Telehealth: Payer: Self-pay | Admitting: Family Medicine

## 2010-12-21 MED ORDER — HYDROCODONE-ACETAMINOPHEN 5-325 MG PO TABS: 1.0000 | ORAL_TABLET | Freq: Four times a day (QID) | ORAL | Status: AC | PRN

## 2010-12-21 NOTE — Telephone Encounter (Signed)
Given to the pt.

## 2010-12-22 ENCOUNTER — Ambulatory Visit (INDEPENDENT_AMBULATORY_CARE_PROVIDER_SITE_OTHER): Payer: 59 | Admitting: Family Medicine

## 2010-12-22 DIAGNOSIS — E539 Vitamin B deficiency, unspecified: Secondary | ICD-10-CM

## 2010-12-22 MED ORDER — CYANOCOBALAMIN 1000 MCG/ML IJ SOLN
1000.0000 ug | Freq: Once | INTRAMUSCULAR | Status: AC
  Administered 2010-12-22: 1000 ug via INTRAMUSCULAR

## 2010-12-29 ENCOUNTER — Telehealth: Payer: Self-pay | Admitting: *Deleted

## 2010-12-29 ENCOUNTER — Ambulatory Visit (INDEPENDENT_AMBULATORY_CARE_PROVIDER_SITE_OTHER): Payer: 59 | Admitting: Family Medicine

## 2010-12-29 DIAGNOSIS — E539 Vitamin B deficiency, unspecified: Secondary | ICD-10-CM

## 2010-12-29 MED ORDER — VENLAFAXINE HCL ER 150 MG PO CP24: 150.0000 mg | ORAL_CAPSULE | Freq: Three times a day (TID) | ORAL | Status: DC

## 2010-12-29 MED ORDER — ATORVASTATIN CALCIUM 20 MG PO TABS: 20.0000 mg | ORAL_TABLET | Freq: Every day | ORAL | Status: DC

## 2010-12-29 MED ORDER — ESOMEPRAZOLE MAGNESIUM 40 MG PO CPDR: 40.0000 mg | DELAYED_RELEASE_CAPSULE | Freq: Every day | ORAL | Status: DC

## 2010-12-29 MED ORDER — CYANOCOBALAMIN 1000 MCG/ML IJ SOLN
1000.0000 ug | Freq: Once | INTRAMUSCULAR | Status: AC
  Administered 2010-12-29: 1000 ug via INTRAMUSCULAR

## 2010-12-29 NOTE — Telephone Encounter (Signed)
done

## 2010-12-29 NOTE — Telephone Encounter (Signed)
Increase effexor. Noticed in the past month to month and 1/2 increase in depression symptoms. Last night hit rock bottom and didn't eat supper until 1am this morning. Currently taking 2 of the 150mg  caps.

## 2011-01-05 ENCOUNTER — Ambulatory Visit (INDEPENDENT_AMBULATORY_CARE_PROVIDER_SITE_OTHER): Payer: 59 | Admitting: Family Medicine

## 2011-01-05 DIAGNOSIS — E538 Deficiency of other specified B group vitamins: Secondary | ICD-10-CM

## 2011-01-05 MED ORDER — CYANOCOBALAMIN 1000 MCG/ML IJ SOLN
1000.0000 ug | Freq: Once | INTRAMUSCULAR | Status: AC
  Administered 2011-01-05: 1000 ug via INTRAMUSCULAR

## 2011-01-11 ENCOUNTER — Ambulatory Visit (INDEPENDENT_AMBULATORY_CARE_PROVIDER_SITE_OTHER): Payer: 59 | Admitting: Family Medicine

## 2011-01-11 DIAGNOSIS — E539 Vitamin B deficiency, unspecified: Secondary | ICD-10-CM

## 2011-01-11 MED ORDER — CYANOCOBALAMIN 1000 MCG/ML IJ SOLN
1000.0000 ug | Freq: Once | INTRAMUSCULAR | Status: AC
  Administered 2011-01-11: 1000 ug via INTRAMUSCULAR

## 2011-01-14 ENCOUNTER — Telehealth: Payer: Self-pay | Admitting: Family Medicine

## 2011-01-14 NOTE — Telephone Encounter (Signed)
We can cancel previous message because pt was seen by Dr. Tawanna Cooler today.

## 2011-01-14 NOTE — Telephone Encounter (Signed)
Pt had a migraine on 12/25 and took a phenergan and vicodin. She was out of work on 12/26 with headache and took zomig and phenergan. She came to work today and stills has a headache. She has already taken a zomig this morning. Please advise?

## 2011-01-18 ENCOUNTER — Telehealth: Payer: Self-pay | Admitting: Family Medicine

## 2011-01-18 ENCOUNTER — Ambulatory Visit: Payer: 59 | Admitting: Family Medicine

## 2011-01-18 NOTE — Telephone Encounter (Signed)
Patient states she has had a migraine for several days and can not see her specialist until Tuesday. Patient requests her dosage of Topiramate be increased to 200 mg with doctor's approval

## 2011-01-20 NOTE — Telephone Encounter (Signed)
She will have seen the specialist yesterday, so we will see what they decided to do

## 2011-01-21 ENCOUNTER — Ambulatory Visit (INDEPENDENT_AMBULATORY_CARE_PROVIDER_SITE_OTHER): Payer: 59 | Admitting: Family Medicine

## 2011-01-21 ENCOUNTER — Telehealth: Payer: Self-pay | Admitting: *Deleted

## 2011-01-21 DIAGNOSIS — E538 Deficiency of other specified B group vitamins: Secondary | ICD-10-CM

## 2011-01-21 DIAGNOSIS — G4730 Sleep apnea, unspecified: Secondary | ICD-10-CM

## 2011-01-21 MED ORDER — CYANOCOBALAMIN 1000 MCG/ML IJ SOLN
1000.0000 ug | Freq: Once | INTRAMUSCULAR | Status: AC
  Administered 2011-01-21: 1000 ug via INTRAMUSCULAR

## 2011-01-21 NOTE — Telephone Encounter (Signed)
Daily headaches since christmas. Not sleeping at night. Tossing and turning. Husband states that I do snore. Would like a referral for a sleep study. Also wondering about getting a rx for Therapeutic massage to help with headaches and depression that has increased since the headaches has increased. This way I can use my flex card for it.

## 2011-01-21 NOTE — Telephone Encounter (Signed)
She needs a sleep study to evaluate for sleep apnea. She has chronic HAs

## 2011-01-22 ENCOUNTER — Ambulatory Visit: Payer: 59 | Admitting: Family Medicine

## 2011-01-26 ENCOUNTER — Ambulatory Visit (INDEPENDENT_AMBULATORY_CARE_PROVIDER_SITE_OTHER): Payer: 59 | Admitting: Family Medicine

## 2011-01-26 DIAGNOSIS — E538 Deficiency of other specified B group vitamins: Secondary | ICD-10-CM

## 2011-01-26 MED ORDER — CYANOCOBALAMIN 1000 MCG/ML IJ SOLN
1000.0000 ug | Freq: Once | INTRAMUSCULAR | Status: AC
  Administered 2011-01-26: 1000 ug via INTRAMUSCULAR

## 2011-02-02 ENCOUNTER — Ambulatory Visit (INDEPENDENT_AMBULATORY_CARE_PROVIDER_SITE_OTHER): Payer: 59 | Admitting: Family Medicine

## 2011-02-02 DIAGNOSIS — E539 Vitamin B deficiency, unspecified: Secondary | ICD-10-CM

## 2011-02-02 MED ORDER — CYANOCOBALAMIN 1000 MCG/ML IJ SOLN
1000.0000 ug | Freq: Once | INTRAMUSCULAR | Status: AC
  Administered 2011-02-02: 1000 ug via INTRAMUSCULAR

## 2011-02-09 ENCOUNTER — Ambulatory Visit (INDEPENDENT_AMBULATORY_CARE_PROVIDER_SITE_OTHER): Payer: 59 | Admitting: Family Medicine

## 2011-02-09 DIAGNOSIS — E539 Vitamin B deficiency, unspecified: Secondary | ICD-10-CM

## 2011-02-09 MED ORDER — CYANOCOBALAMIN 1000 MCG/ML IJ SOLN
1000.0000 ug | Freq: Once | INTRAMUSCULAR | Status: AC
  Administered 2011-02-09: 1000 ug via INTRAMUSCULAR

## 2011-02-10 ENCOUNTER — Ambulatory Visit (INDEPENDENT_AMBULATORY_CARE_PROVIDER_SITE_OTHER): Payer: 59 | Admitting: Family Medicine

## 2011-02-10 DIAGNOSIS — Z0289 Encounter for other administrative examinations: Secondary | ICD-10-CM

## 2011-02-10 DIAGNOSIS — G43909 Migraine, unspecified, not intractable, without status migrainosus: Secondary | ICD-10-CM

## 2011-02-10 MED ORDER — KETOROLAC TROMETHAMINE 60 MG/2ML IM SOLN
60.0000 mg | Freq: Once | INTRAMUSCULAR | Status: AC
  Administered 2011-02-10: 60 mg via INTRAMUSCULAR

## 2011-02-15 ENCOUNTER — Telehealth: Payer: Self-pay | Admitting: *Deleted

## 2011-02-15 DIAGNOSIS — E785 Hyperlipidemia, unspecified: Secondary | ICD-10-CM

## 2011-02-15 DIAGNOSIS — E538 Deficiency of other specified B group vitamins: Secondary | ICD-10-CM

## 2011-02-15 NOTE — Telephone Encounter (Signed)
Need written rx's on lorazepam and effexor. I will see Dawn Jensen on 2/21. So a month supply will be fine. I'm taking 3 at bedtime on the effexor. Also when do you want me to get my labs done and what labs need to be done?

## 2011-02-16 ENCOUNTER — Ambulatory Visit (INDEPENDENT_AMBULATORY_CARE_PROVIDER_SITE_OTHER): Payer: 59 | Admitting: Family Medicine

## 2011-02-16 DIAGNOSIS — E538 Deficiency of other specified B group vitamins: Secondary | ICD-10-CM | POA: Insufficient documentation

## 2011-02-16 MED ORDER — CYANOCOBALAMIN 1000 MCG/ML IJ SOLN
1000.0000 ug | Freq: Once | INTRAMUSCULAR | Status: AC
  Administered 2011-02-16: 1000 ug via INTRAMUSCULAR

## 2011-02-16 MED ORDER — VENLAFAXINE HCL ER 150 MG PO CP24: 150.0000 mg | ORAL_CAPSULE | Freq: Three times a day (TID) | ORAL | Status: DC

## 2011-02-16 MED ORDER — LORAZEPAM 0.5 MG PO TABS: 0.5000 mg | ORAL_TABLET | Freq: Three times a day (TID) | ORAL | Status: DC | PRN

## 2011-02-16 NOTE — Telephone Encounter (Signed)
done

## 2011-02-18 ENCOUNTER — Encounter: Payer: Self-pay | Admitting: Internal Medicine

## 2011-02-18 ENCOUNTER — Ambulatory Visit (INDEPENDENT_AMBULATORY_CARE_PROVIDER_SITE_OTHER): Payer: 59 | Admitting: Internal Medicine

## 2011-02-18 VITALS — BP 118/62 | HR 96 | Ht 62.0 in | Wt 206.6 lb

## 2011-02-18 DIAGNOSIS — G4733 Obstructive sleep apnea (adult) (pediatric): Secondary | ICD-10-CM

## 2011-02-18 NOTE — Progress Notes (Signed)
02/18/11- 36 yoF never smoker referred courtesy of Dr. Clent Ridges, concerned about her sleep quality. Husband reports loud snoring, without noticing apnea. She tosses and turns during the night. Would like to sleep through the weekend. Wakes frequently during the night for no apparent reason but will then stay awake. She runs 2 fans in the bedroom to drown out her husband's snoring. Physically comfortable in bed. Sleeps propped on 3 pillows. They keep the house cool. She denies nasal congestion, shortness of breath, reflux problems at night. Medical history of depression/anxiety. Tonsils and adenoids out as a child. No history of thyroid, heart or lung disease. Occasional lorazepam helps sleep. Questions if  sleep issues aggravate her migraines. Mother has sleep apnea and wears CPAP.  ROS-see HPI Constitutional:   No-   weight loss, night sweats, fevers, chills,  +fatigue, lassitude. HEENT:   No-  headaches, difficulty swallowing, tooth/dental problems, sore throat,       No-  sneezing, itching, ear ache, nasal congestion, post nasal drip,  CV:  No-   chest pain, orthopnea, PND, swelling in lower extremities, anasarca, dizziness, palpitations Resp: No-   shortness of breath with exertion or at rest.              No-   productive cough,  No non-productive cough,  No- coughing up of blood.              No-   change in color of mucus.  No- wheezing.   Skin: No-   rash or lesions. GI:  No-   heartburn, indigestion, abdominal pain, nausea, vomiting, diarrhea,                 change in bowel habits, loss of appetite GU: MS:  No-   joint pain or swelling.  No- decreased range of motion.  No- back pain. Neuro-     nothing unusual Psych:  No- change in mood or affect. + hx depression or anxiety.  No memory loss.  OBJ- Physical Exam General- Alert, Oriented, Affect-appropriate, Distress- none acute, overweight Skin- rash-none, lesions- none, excoriation- none Lymphadenopathy- none Head- atraumatic  Eyes- Gross vision intact, PERRLA, conjunctivae and secretions clear            Ears- Hearing, canals-normal            Nose- Clear, no-Septal dev, mucus, polyps, erosion, perforation             Throat- Mallampati II-III , mucosa clear , drainage- none, tonsils- atrophic Neck- flexible , trachea midline, no stridor , thyroid nl, carotid no bruit Chest - symmetrical excursion , unlabored           Heart/CV- RRR , no murmur , no gallop  , no rub, nl s1 s2                           - JVD- none , edema- none, stasis changes- none, varices- none           Lung- clear to P&A, wheeze- none, cough- none , dullness-none, rub- none           Chest wall-  Abd-  Br/ Gen/ Rectal- Not done, not indicated Extrem- cyanosis- none, clubbing, none, atrophy- none, strength- nl Neuro- grossly intact to observation

## 2011-02-18 NOTE — Patient Instructions (Signed)
Order- split protocol NPSG   Dx OSA 

## 2011-02-18 NOTE — Assessment & Plan Note (Signed)
She is aware of loud snoring and fragmented sleep with tendency to stay awake once disturbed at night. She has family history of sleep apnea. I habitus and pharyngeal spacing make snoring likely, as reported to her by her husband. We discussed the medical issues, physiology and sleep study data 20 towards a diagnosis of sleep apnea. Considered a home study but her restless, disturbed sleep may be reflecting additional problems such as leg jerks. We finally agreed a standard attended overnight sleep study. This will permit CPAP titration if she meets the criteria for a split night study.

## 2011-02-22 ENCOUNTER — Telehealth: Payer: Self-pay | Admitting: Family Medicine

## 2011-02-22 MED ORDER — ONDANSETRON 8 MG PO TBDP: 8.0000 mg | ORAL_TABLET | Freq: Three times a day (TID) | ORAL | Status: AC | PRN

## 2011-02-22 NOTE — Telephone Encounter (Signed)
Needs something for vomiting

## 2011-02-23 ENCOUNTER — Ambulatory Visit (INDEPENDENT_AMBULATORY_CARE_PROVIDER_SITE_OTHER): Payer: 59 | Admitting: Family Medicine

## 2011-02-23 DIAGNOSIS — E539 Vitamin B deficiency, unspecified: Secondary | ICD-10-CM

## 2011-02-23 MED ORDER — CYANOCOBALAMIN 1000 MCG/ML IJ SOLN
1000.0000 ug | Freq: Once | INTRAMUSCULAR | Status: AC
  Administered 2011-02-23: 1000 ug via INTRAMUSCULAR

## 2011-03-02 ENCOUNTER — Other Ambulatory Visit (INDEPENDENT_AMBULATORY_CARE_PROVIDER_SITE_OTHER): Payer: 59

## 2011-03-02 ENCOUNTER — Ambulatory Visit: Payer: 59 | Admitting: Family Medicine

## 2011-03-02 DIAGNOSIS — E785 Hyperlipidemia, unspecified: Secondary | ICD-10-CM

## 2011-03-02 DIAGNOSIS — E538 Deficiency of other specified B group vitamins: Secondary | ICD-10-CM

## 2011-03-02 LAB — HEPATIC FUNCTION PANEL
Albumin: 3.9 g/dL (ref 3.5–5.2)
Total Bilirubin: 0.5 mg/dL (ref 0.3–1.2)

## 2011-03-02 LAB — LIPID PANEL
HDL: 35.3 mg/dL — ABNORMAL LOW (ref >39.00)
Total CHOL/HDL Ratio: 4
Triglycerides: 79 mg/dL (ref 0.0–149.0)
VLDL: 15.8 mg/dL (ref 0.0–40.0)

## 2011-03-02 LAB — VITAMIN B12: Vitamin B-12: 540 pg/mL (ref 211–911)

## 2011-03-02 MED ORDER — CYANOCOBALAMIN 1000 MCG/ML IJ SOLN
1000.0000 ug | Freq: Once | INTRAMUSCULAR | Status: AC
  Administered 2011-03-02: 1000 ug via INTRAMUSCULAR

## 2011-03-03 NOTE — Progress Notes (Signed)
Quick Note:  Spoke to pt and gave a copy of results. ______

## 2011-03-04 ENCOUNTER — Telehealth: Payer: Self-pay | Admitting: *Deleted

## 2011-03-04 NOTE — Telephone Encounter (Signed)
Cough and congestion x 2-3 weeks, no fever or sob. Cough worse x 3 days. Would like something called in for the cough that is a non-narcotic but not tessalon pearls if possible. So I can take it during the day for the cough.

## 2011-03-04 NOTE — Telephone Encounter (Signed)
I would try Robitussin DM OTC.

## 2011-03-16 ENCOUNTER — Telehealth: Payer: Self-pay | Admitting: Family Medicine

## 2011-03-16 NOTE — Telephone Encounter (Signed)
Updated medication change for Effexor.

## 2011-03-19 ENCOUNTER — Encounter (HOSPITAL_BASED_OUTPATIENT_CLINIC_OR_DEPARTMENT_OTHER): Payer: 59

## 2011-03-23 ENCOUNTER — Ambulatory Visit (INDEPENDENT_AMBULATORY_CARE_PROVIDER_SITE_OTHER): Payer: 59 | Admitting: Family Medicine

## 2011-03-23 ENCOUNTER — Encounter: Payer: Self-pay | Admitting: Family Medicine

## 2011-03-23 VITALS — BP 118/78 | HR 97 | Temp 99.3°F | Wt 204.0 lb

## 2011-03-23 DIAGNOSIS — J329 Chronic sinusitis, unspecified: Secondary | ICD-10-CM

## 2011-03-23 MED ORDER — HYDROCODONE-HOMATROPINE 5-1.5 MG/5ML PO SYRP: 5.0000 mL | ORAL_SOLUTION | ORAL | Status: AC | PRN

## 2011-03-23 MED ORDER — AZITHROMYCIN 250 MG PO TABS: ORAL_TABLET | ORAL | Status: AC

## 2011-03-24 ENCOUNTER — Encounter: Payer: Self-pay | Admitting: Family Medicine

## 2011-03-24 NOTE — Progress Notes (Signed)
  Subjective:    Patient ID: Dawn Jensen, female    DOB: 25-Feb-1975, 36 y.o.   MRN: [id number]  HPI Here for one week of sinus pressure, PND, ST, and a dry cough. She has some much PND it gags her. No vomiting. Using Mucinex.    Review of Systems  Constitutional: Negative.   HENT: Positive for congestion and postnasal drip.   Eyes: Negative.   Respiratory: Positive for cough.        Objective:   Physical Exam  Constitutional: She appears well-developed and well-nourished.  HENT:  Right Ear: External ear normal.  Left Ear: External ear normal.  Nose: Nose normal.  Mouth/Throat: Oropharynx is clear and moist. No oropharyngeal exudate.  Eyes: Conjunctivae are normal.  Neck: No thyromegaly present.  Pulmonary/Chest: Effort normal and breath sounds normal. No respiratory distress. She has no wheezes. She has no rales. She exhibits no tenderness.  Lymphadenopathy:    She has no cervical adenopathy.          Assessment & Plan:  Rest, fluids, given a Zpack. Off work today.

## 2011-03-25 ENCOUNTER — Telehealth: Payer: Self-pay | Admitting: Family Medicine

## 2011-03-25 NOTE — Telephone Encounter (Signed)
I updated medication list, there was a new one added and a increase in another.

## 2011-03-30 ENCOUNTER — Ambulatory Visit: Payer: 59 | Admitting: Family Medicine

## 2011-03-31 ENCOUNTER — Ambulatory Visit (INDEPENDENT_AMBULATORY_CARE_PROVIDER_SITE_OTHER): Payer: 59 | Admitting: Family Medicine

## 2011-03-31 DIAGNOSIS — E538 Deficiency of other specified B group vitamins: Secondary | ICD-10-CM

## 2011-03-31 MED ORDER — CYANOCOBALAMIN 1000 MCG/ML IJ SOLN
1000.0000 ug | Freq: Once | INTRAMUSCULAR | Status: AC
  Administered 2011-03-31: 1000 ug via INTRAMUSCULAR

## 2011-04-13 ENCOUNTER — Telehealth: Payer: Self-pay | Admitting: *Deleted

## 2011-04-13 ENCOUNTER — Ambulatory Visit (INDEPENDENT_AMBULATORY_CARE_PROVIDER_SITE_OTHER): Payer: 59 | Admitting: Family Medicine

## 2011-04-13 DIAGNOSIS — R51 Headache: Secondary | ICD-10-CM

## 2011-04-13 MED ORDER — KETOROLAC TROMETHAMINE 60 MG/2ML IM SOLN
60.0000 mg | Freq: Once | INTRAMUSCULAR | Status: AC
  Administered 2011-04-13: 60 mg via INTRAMUSCULAR

## 2011-04-13 NOTE — Telephone Encounter (Signed)
Noted. I spoke with her this morning. We will keep the Effexor at a total of 150 mg a day for the time being. She is to never change the dose without discussing this with [place] first. She agrees

## 2011-04-13 NOTE — Telephone Encounter (Signed)
I have decreased my effexor to 2 of the 75mg  a day. I was suppose to go down to 3 of the 75mg  on thurs but then that caused a migraine. So I went down to 2 on Friday then off on sat. I was totally messed up Sat, sun and Monday with severe headaches and nausea. I still have a headache today without nausea. I also have something personal I need to tell you but don't want to but in the chart. I can't see crystal until 2 weeks due to the driving situation with having only one car.

## 2011-04-15 ENCOUNTER — Ambulatory Visit (HOSPITAL_BASED_OUTPATIENT_CLINIC_OR_DEPARTMENT_OTHER): Payer: 59

## 2011-04-15 ENCOUNTER — Telehealth: Payer: Self-pay | Admitting: Family Medicine

## 2011-04-15 MED ORDER — LORAZEPAM 0.5 MG PO TABS: 0.5000 mg | ORAL_TABLET | Freq: Three times a day (TID) | ORAL | Status: DC | PRN

## 2011-04-15 NOTE — Telephone Encounter (Signed)
Pt is requesting a refill on Lorazepam and send to [facility].

## 2011-04-15 NOTE — Telephone Encounter (Signed)
Call in #60 with no rf 

## 2011-04-15 NOTE — Telephone Encounter (Signed)
Script was called in.

## 2011-04-19 ENCOUNTER — Telehealth: Payer: Self-pay | Admitting: Family Medicine

## 2011-04-19 NOTE — Telephone Encounter (Signed)
Pt called and she is requesting a vial of B12 ( 30 ml ) and send to [facility] in [place], also needs syringes #10. She will be giving herself the weekly injections, instead of coming in to the office.

## 2011-04-25 NOTE — Telephone Encounter (Signed)
Call in a one year supply of both

## 2011-04-27 ENCOUNTER — Ambulatory Visit: Payer: 59 | Admitting: Family Medicine

## 2011-04-27 NOTE — Telephone Encounter (Signed)
Message left to return call.  Clarification needed for RX for syringes.

## 2011-04-27 NOTE — Telephone Encounter (Signed)
Dr. Clent Ridges, pt wants to use OTC B12 due to cost issues for injectable.  Please advise if OK and dose needed.  Thanks.

## 2011-04-27 NOTE — Telephone Encounter (Signed)
Pt called and said that the 10 ml vials of b-12 vials are on back order and pt does not want to do 30 mls, due to cost. Pt is wondering if she can do b-12 otc instead? [facility] in [place]. Pt req Call back from nurse.

## 2011-04-27 NOTE — Telephone Encounter (Signed)
RC from pt.  She is going to hold off on injection for now as 10 ml vial is on back order.

## 2011-04-28 NOTE — Telephone Encounter (Signed)
RC from pt.  Notified of advice below.  She is agreeable.

## 2011-04-28 NOTE — Telephone Encounter (Signed)
Tell her to take 2000 mcg of OTC vitamin B12 daily

## 2011-04-28 NOTE — Telephone Encounter (Signed)
I have attempted to contact this patient by phone with the following results: left message to return my call on answering machine (home).  

## 2011-04-30 ENCOUNTER — Telehealth: Payer: Self-pay | Admitting: Family Medicine

## 2011-04-30 NOTE — Telephone Encounter (Signed)
On contacting the patient, she said she was able to have her Neurologist take care of this

## 2011-04-30 NOTE — Telephone Encounter (Signed)
Pt unable to make ov due to no Insurance. Pt has taken chlorpromazine 10 mg 5 tablets,one toradol and generic zomig ns no relief. Pt has called her neurologist still waiting for a return call. [facility] [place]

## 2011-05-25 ENCOUNTER — Encounter: Payer: Self-pay | Admitting: Family Medicine

## 2011-06-01 ENCOUNTER — Ambulatory Visit: Payer: 59 | Admitting: Family Medicine

## 2011-06-29 ENCOUNTER — Ambulatory Visit: Payer: 59 | Admitting: Family Medicine

## 2011-07-09 ENCOUNTER — Telehealth: Payer: Self-pay | Admitting: Family Medicine

## 2011-07-09 MED ORDER — HYDROCODONE-ACETAMINOPHEN 5-325 MG PO TABS: 1.0000 | ORAL_TABLET | Freq: Four times a day (QID) | ORAL | Status: DC | PRN

## 2011-07-09 NOTE — Telephone Encounter (Signed)
Pt requesting a refill on HYDROcodone-acetaminophen (NORCO) 5-325 MG per tablet Pt stated that "Crystal" will not refill until she has an appt. Pt stated she will not be able to see her for 6 months   [facility] [place]

## 2011-07-09 NOTE — Telephone Encounter (Signed)
I called in script 

## 2011-07-09 NOTE — Telephone Encounter (Signed)
Call in #60 with 2 rf 

## 2011-07-27 ENCOUNTER — Ambulatory Visit: Payer: 59 | Admitting: Family Medicine

## 2011-07-29 ENCOUNTER — Telehealth: Payer: Self-pay | Admitting: Family Medicine

## 2011-07-29 MED ORDER — RANITIDINE HCL 300 MG PO TABS: 300.0000 mg | ORAL_TABLET | Freq: Two times a day (BID) | ORAL | Status: DC

## 2011-07-29 NOTE — Telephone Encounter (Signed)
done

## 2011-07-29 NOTE — Telephone Encounter (Signed)
Pt called and said that she needs to have the Nexium changed to Zantac 300mg  to [facility] in [place].

## 2011-08-31 ENCOUNTER — Telehealth: Payer: Self-pay

## 2011-08-31 ENCOUNTER — Ambulatory Visit: Payer: 59 | Admitting: Family Medicine

## 2011-08-31 NOTE — Telephone Encounter (Signed)
DC the Zantac and call in Protonix 40 mg a day, #180 with one rf to be mailed to her

## 2011-08-31 NOTE — Telephone Encounter (Signed)
VM - zantac 300 not working anymore - requesting new rx for protonix - 6mos with refills (not 90 days but 6mos) , to [facility] - have them send it in the mail    Please advise

## 2011-09-01 MED ORDER — PANTOPRAZOLE SODIUM 40 MG PO TBEC: 40.0000 mg | DELAYED_RELEASE_TABLET | Freq: Every day | ORAL | Status: DC

## 2011-09-01 NOTE — Telephone Encounter (Signed)
done

## 2011-09-08 ENCOUNTER — Telehealth: Payer: Self-pay | Admitting: Family Medicine

## 2011-09-08 NOTE — Telephone Encounter (Signed)
Left detailed msg on pt vm

## 2011-09-08 NOTE — Telephone Encounter (Signed)
Okay but I would wait to closer to the time of the cpx. Contact me again in late December

## 2011-09-08 NOTE — Telephone Encounter (Signed)
Can you call pt back and give the below message?

## 2011-09-08 NOTE — Telephone Encounter (Signed)
Patient called and stated she would like for the MD to mail her an rx to have her cpx labs done elsewhere for her cpx scheduled 02/07/12. Please assist.

## 2011-09-13 ENCOUNTER — Other Ambulatory Visit: Payer: Self-pay | Admitting: Family Medicine

## 2011-09-14 NOTE — Telephone Encounter (Signed)
Call in #60 with 5 rf 

## 2011-10-04 ENCOUNTER — Encounter: Payer: Self-pay | Admitting: Family Medicine

## 2011-10-04 ENCOUNTER — Ambulatory Visit (INDEPENDENT_AMBULATORY_CARE_PROVIDER_SITE_OTHER): Payer: Self-pay | Admitting: Family Medicine

## 2011-10-04 ENCOUNTER — Telehealth: Payer: Self-pay | Admitting: Family Medicine

## 2011-10-04 VITALS — BP 124/86 | HR 86 | Temp 98.6°F

## 2011-10-04 DIAGNOSIS — F329 Major depressive disorder, single episode, unspecified: Secondary | ICD-10-CM

## 2011-10-04 DIAGNOSIS — G43909 Migraine, unspecified, not intractable, without status migrainosus: Secondary | ICD-10-CM

## 2011-10-04 DIAGNOSIS — I10 Essential (primary) hypertension: Secondary | ICD-10-CM

## 2011-10-04 MED ORDER — HYDROCODONE-ACETAMINOPHEN 5-325 MG PO TABS: 1.0000 | ORAL_TABLET | Freq: Four times a day (QID) | ORAL | Status: DC | PRN

## 2011-10-04 MED ORDER — TOPIRAMATE 200 MG PO TABS: 200.0000 mg | ORAL_TABLET | Freq: Every day | ORAL | Status: DC

## 2011-10-04 MED ORDER — PROMETHAZINE HCL 50 MG/ML IJ SOLN
50.0000 mg | Freq: Once | INTRAMUSCULAR | Status: AC
  Administered 2011-10-04: 50 mg via INTRAMUSCULAR

## 2011-10-04 MED ORDER — MEPERIDINE HCL 50 MG/ML IJ SOLN
100.0000 mg | Freq: Once | INTRAMUSCULAR | Status: AC
  Administered 2011-10-04: 100 mg via INTRAMUSCULAR

## 2011-10-04 MED ORDER — NORETHIN-ETH ESTRAD-FE BIPHAS 1 MG-10 MCG / 10 MCG PO TABS: 1.0000 | ORAL_TABLET | Freq: Every day | ORAL | Status: DC

## 2011-10-04 MED ORDER — KETOROLAC TROMETHAMINE 10 MG PO TABS: 10.0000 mg | ORAL_TABLET | Freq: Four times a day (QID) | ORAL | Status: DC | PRN

## 2011-10-04 NOTE — Telephone Encounter (Signed)
I spoke with pharmacy and the most pt can get is #20 and 0 refills. I did give the verbal order and changed in the computer.

## 2011-10-04 NOTE — Telephone Encounter (Signed)
[facility] in [place] called and is needing clarification on Sig for ketorolac (TORADOL) 10 MG tablet

## 2011-10-04 NOTE — Addendum Note (Signed)
Addended by: Aniceto Boss A on: 10/04/2011 10:41 AM   Modules accepted: Orders

## 2011-10-04 NOTE — Progress Notes (Signed)
  Subjective:    Patient ID: Dawn Jensen, female    DOB: [birth year], 36 y.o.   MRN: [id number]  HPI Here for 3 days of a migraine HA with nausea and lightheadedness. She has passed out twice in the past 2 days with the HA. Her mother drove her here today. Her Abilify dose was recently increased to 5 mg  A day. She is starting a menstrual cycle today also.   Review of Systems  Constitutional: Negative.   Respiratory: Negative.   Cardiovascular: Negative.   Neurological: Positive for syncope, light-headedness and headaches. Negative for dizziness, tremors, seizures, speech difficulty, weakness and numbness.       Objective:   Physical Exam  Constitutional: She is oriented to person, place, and time. She appears well-developed and well-nourished.       Alert, in pain, photophobic   HENT:  Head: Normocephalic and atraumatic.  Eyes: Conjunctivae normal are normal. Pupils are equal, round, and reactive to light.  Neck: Neck supple. No thyromegaly present.  Cardiovascular: Normal rate, regular rhythm, normal heart sounds and intact distal pulses.   Pulmonary/Chest: Effort normal and breath sounds normal.  Lymphadenopathy:    She has no cervical adenopathy.  Neurological: She is alert and oriented to person, place, and time. She has normal reflexes. No cranial nerve deficit. She exhibits normal muscle tone. Coordination normal.          Assessment & Plan:  Her migraines probably coincide with her menses. That and the recent dose change for Abilify probably account for the syncopal spells. Given a shot of Demerol today. Start on Loestrin FE until she can see her GYN in a month or two.

## 2011-10-05 ENCOUNTER — Telehealth: Payer: Self-pay | Admitting: Family Medicine

## 2011-10-05 ENCOUNTER — Encounter: Payer: Self-pay | Admitting: Family Medicine

## 2011-10-06 MED ORDER — NORETHIN ACE-ETH ESTRAD-FE 1-20 MG-MCG PO TABS: 1.0000 | ORAL_TABLET | Freq: Every day | ORAL | Status: DC

## 2011-10-06 NOTE — Addendum Note (Signed)
Addended by: Gershon Crane A on: 10/06/2011 08:42 AM   Modules accepted: Orders

## 2011-10-07 NOTE — Telephone Encounter (Signed)
error 

## 2011-10-18 ENCOUNTER — Telehealth: Payer: Self-pay | Admitting: Family Medicine

## 2011-10-18 DIAGNOSIS — E538 Deficiency of other specified B group vitamins: Secondary | ICD-10-CM

## 2011-10-18 NOTE — Telephone Encounter (Signed)
Pt is sch for cpx labs 01/11/12 req vit B-12 lvl to be tested. Pls add lab order.

## 2011-10-18 NOTE — Telephone Encounter (Signed)
done

## 2011-11-16 ENCOUNTER — Encounter: Payer: Self-pay | Admitting: Family Medicine

## 2011-11-17 ENCOUNTER — Telehealth: Payer: Self-pay | Admitting: Family Medicine

## 2011-11-17 ENCOUNTER — Encounter: Payer: Self-pay | Admitting: Family Medicine

## 2011-11-17 MED ORDER — HYDROCODONE-HOMATROPINE 5-1.5 MG/5ML PO SYRP: 5.0000 mL | ORAL_SOLUTION | ORAL | Status: DC | PRN

## 2011-11-17 NOTE — Addendum Note (Signed)
Addended by: Jackson Latino on: 11/17/2011 09:57 AM   Modules accepted: Orders

## 2011-11-17 NOTE — Telephone Encounter (Signed)
Call in Hydromet one tsp q 4 hours prn cough, 240 ml with no rf 

## 2011-11-17 NOTE — Telephone Encounter (Signed)
Called in Rx

## 2011-12-10 ENCOUNTER — Encounter: Payer: Self-pay | Admitting: Family

## 2011-12-10 ENCOUNTER — Ambulatory Visit: Payer: Self-pay | Admitting: Family Medicine

## 2011-12-10 ENCOUNTER — Ambulatory Visit (INDEPENDENT_AMBULATORY_CARE_PROVIDER_SITE_OTHER): Payer: BC Managed Care – PPO | Admitting: Family

## 2011-12-10 VITALS — BP 112/78 | HR 77 | Temp 98.1°F | Resp 16 | Ht 62.0 in | Wt 208.1 lb

## 2011-12-10 DIAGNOSIS — J45909 Unspecified asthma, uncomplicated: Secondary | ICD-10-CM

## 2011-12-10 DIAGNOSIS — F329 Major depressive disorder, single episode, unspecified: Secondary | ICD-10-CM

## 2011-12-10 DIAGNOSIS — K219 Gastro-esophageal reflux disease without esophagitis: Secondary | ICD-10-CM

## 2011-12-10 DIAGNOSIS — G43909 Migraine, unspecified, not intractable, without status migrainosus: Secondary | ICD-10-CM

## 2011-12-10 DIAGNOSIS — E785 Hyperlipidemia, unspecified: Secondary | ICD-10-CM

## 2011-12-10 DIAGNOSIS — E538 Deficiency of other specified B group vitamins: Secondary | ICD-10-CM

## 2011-12-10 DIAGNOSIS — G4733 Obstructive sleep apnea (adult) (pediatric): Secondary | ICD-10-CM

## 2011-12-10 DIAGNOSIS — I10 Essential (primary) hypertension: Secondary | ICD-10-CM

## 2011-12-10 DIAGNOSIS — Z23 Encounter for immunization: Secondary | ICD-10-CM

## 2011-12-10 MED ORDER — KETOROLAC TROMETHAMINE 10 MG PO TABS: 10.0000 mg | ORAL_TABLET | Freq: Four times a day (QID) | ORAL | Status: DC | PRN

## 2011-12-10 MED ORDER — CYANOCOBALAMIN 1000 MCG/ML IJ SOLN
1000.0000 ug | Freq: Once | INTRAMUSCULAR | Status: AC
  Administered 2011-12-10: 1000 ug via INTRAMUSCULAR

## 2011-12-10 MED ORDER — ONDANSETRON 4 MG PO TBDP: 4.0000 mg | ORAL_TABLET | Freq: Three times a day (TID) | ORAL | Status: DC | PRN

## 2011-12-10 MED ORDER — LEVONORGEST-ETH ESTRAD 91-DAY 0.15-0.03 MG PO TABS: 1.0000 | ORAL_TABLET | Freq: Every day | ORAL | Status: DC

## 2011-12-10 NOTE — Progress Notes (Signed)
Subjective:    Patient ID: Dawn Jensen, female    DOB: 1976-01-03, 36 y.o.   MRN: [id number]  HPI  Dawn Jensen is a 36 yr old patient new to our office to establish care. She has followed with Dr. Clent Ridges previously at the [facility] location.    Migraines- sees Hali Marry NP (Neuro). She reports daily headaches- worse before her periods.  She has had floaters and nausea which has been triggered by an odor at her work.  She used ibuprofen.  She also uses Treximet.  She would like to switch OCP to Seasonale.    Depression- Rosebud Poles NP Psychiatry.  On abilify and effexor.  Reports well controlled.    Asthma- reports that this has not been bothering her.  Reports that she generally only has wheezing with seasonal change or URI symptoms.   Hyperlipidemia-  She is maintained on a statin.  Husband is sterile.    B12 deficiency- she reports that she has been doing tablets only since she lost tablets. Her last injection was in the end of March.   GERD-  Reports that this is well controlled with protonix.   HTN- She is on nadolol mainly for headaches.    OSA- she would like a sleep study.  She will make apt with Dr. Maple Hudson.      Review of Systems See HPI  Past Medical History  Diagnosis Date  . Allergy   . Hyperlipidemia   . IBS (irritable bowel syndrome)   . Migraines     sees Hali Marry NP at [facility]   . Hypertension   . GERD (gastroesophageal reflux disease)   . Asthma     sees Dr. Gary Fleet  . PPD positive, treated     treated with inh  . Depression     sees Valinda Hoar NP    History   Social History  . Marital Status: Married    Spouse Name: N/A    Number of Children: 0  . Years of Education: N/A   Occupational History  . CMA    Social History Main Topics  . Smoking status: Never Smoker   . Smokeless tobacco: Never Used  . Alcohol Use: No     Comment: rarely  . Drug Use: No  . Sexually Active: Not on file   Other Topics Concern  .  Not on file   Social History Narrative  . No narrative on file    Past Surgical History  Procedure Date  . Adenoidectomy   . Tympanostomy tube placement     bilateral ears, as a child    Family History  Problem Relation Age of Onset  . Hyperlipidemia Mother   . Diabetes Mother   . Hyperthyroidism Mother   . Hypertension Mother   . ADD / ADHD Sister   . Osteoporosis Maternal Grandmother   . Diabetes Maternal Grandmother   . Hypertension Maternal Grandmother   . Heart disease Maternal Grandmother   . Stroke Maternal Grandfather 60  . Cancer Paternal Grandfather     brain cancer    Allergies  Allergen Reactions  . Erythromycin     rash  . Moxifloxacin     REACTION: nausea  . Sulfamethoxazole     REACTION: GI upset    Current Outpatient Prescriptions on File Prior to Visit  Medication Sig Dispense Refill  . ARIPiprazole (ABILIFY) 5 MG tablet Take 5 mg by mouth daily.      Marland Kitchen atorvastatin (LIPITOR) 20  MG tablet Take 1 tablet (20 mg total) by mouth daily.  90 tablet  3  . HYDROcodone-acetaminophen (NORCO/VICODIN) 5-325 MG per tablet Take 1 tablet by mouth every 6 (six) hours as needed for pain.  60 tablet  5  . LORazepam (ATIVAN) 0.5 MG tablet Take 1 tablet (0.5 mg total) by mouth 3 (three) times daily as needed for anxiety.  60 tablet  0  . nadolol (CORGARD) 40 MG tablet Take 80 mg by mouth at bedtime.       . norethindrone-ethinyl estradiol (JUNEL FE,GILDESS FE,LOESTRIN FE) 1-20 MG-MCG tablet Take 1 tablet by mouth daily.  1 Package  11  . pantoprazole (PROTONIX) 40 MG tablet Take 1 tablet (40 mg total) by mouth daily.  180 tablet  1  . SUMAtriptan-naproxen (TREXIMET) 85-500 MG per tablet Take 1 tablet by mouth every 2 (two) hours as needed.      . topiramate (TOPAMAX) 200 MG tablet Take 1 tablet (200 mg total) by mouth daily.  1 tablet  0  . zolmitriptan (ZOMIG) 5 MG nasal solution Place 1 spray into the nose as needed.      . chlorproMAZINE (THORAZINE) 10 MG tablet  Take 10 mg by mouth as needed. Take 1 tablet up to every hour ( maximum of 5 tablets daily )      . HYDROcodone-homatropine (HYCODAN) 5-1.5 MG/5ML syrup Take 5 mLs by mouth every 4 (four) hours as needed for cough.  240 mL  0  . promethazine (PHENERGAN) 25 MG tablet Take 25 mg by mouth every 6 (six) hours as needed.         Current Facility-Administered Medications on File Prior to Visit  Medication Dose Route Frequency Provider Last Rate Last Dose  . promethazine (PHENERGAN) injection 50 mg  50 mg Intramuscular Q6H PRN Nelwyn Salisbury, MD   50 mg at 10/02/10 1217    BP 112/78  Pulse 77  Temp 98.1 F (36.7 C) (Oral)  Resp 16  Ht 5\' 2"  (1.575 m)  Wt 208 lb 1.3 oz (94.384 kg)  BMI 38.06 kg/m2  SpO2 99%  LMP 11/28/2011       Objective:   Physical Exam  Constitutional: She appears well-developed and well-nourished. No distress.  HENT:  Head: Normocephalic and atraumatic.  Eyes: Pupils are equal, round, and reactive to light. No scleral icterus.  Cardiovascular: Normal rate and regular rhythm.   No murmur heard. Pulmonary/Chest: Effort normal and breath sounds normal. No respiratory distress. She has no wheezes. She has no rales. She exhibits no tenderness.  Musculoskeletal: She exhibits no edema.  Neurological: She is alert.  Skin: Skin is warm and dry.  Psychiatric: She has a normal mood and affect. Her behavior is normal. Judgment and thought content normal.          Assessment & Plan:

## 2011-12-10 NOTE — Assessment & Plan Note (Signed)
BP stable on corgard.  Continue same.

## 2011-12-10 NOTE — Assessment & Plan Note (Signed)
Check b12 level today.  B12 shot.

## 2011-12-10 NOTE — Patient Instructions (Addendum)
Please complete your blood work prior to leaving. Schedule a fasting physical within the next 3 months.

## 2011-12-10 NOTE — Assessment & Plan Note (Signed)
Pt will call Dr. Roxy Cedar office to arrange sleep study.

## 2011-12-10 NOTE — Assessment & Plan Note (Signed)
I reminded pt that she needs to remain on birth control if she takes statin due to risk to fetus with statin. She verbalizes understanding.

## 2011-12-10 NOTE — Assessment & Plan Note (Signed)
Stable on PPI, continue same.  

## 2011-12-10 NOTE — Assessment & Plan Note (Signed)
Stable on effexor and abilify.  This is followed by Rosebud Poles NP

## 2011-12-10 NOTE — Assessment & Plan Note (Signed)
Stable, monitor.  °

## 2011-12-10 NOTE — Assessment & Plan Note (Signed)
She wishes to switch to seasonale to see if this will help with her menstrual migraines. Will switch.  She will need to complete pap at her upcoming physical.

## 2011-12-11 LAB — VITAMIN B12: Vitamin B-12: >2000 pg/mL — ABNORMAL HIGH (ref 211–911)

## 2011-12-28 ENCOUNTER — Encounter: Payer: Self-pay | Admitting: Family

## 2011-12-28 MED ORDER — ESOMEPRAZOLE MAGNESIUM 40 MG PO PACK: 40.0000 mg | PACK | Freq: Every day | ORAL | Status: DC

## 2011-12-30 ENCOUNTER — Telehealth: Payer: Self-pay | Admitting: Family

## 2011-12-30 MED ORDER — ESOMEPRAZOLE MAGNESIUM 40 MG PO CPDR: 40.0000 mg | DELAYED_RELEASE_CAPSULE | Freq: Every day | ORAL | Status: DC

## 2011-12-30 NOTE — Telephone Encounter (Signed)
Spoke with Sam and advised him Rx should have been for capsules. Correction made to med list.

## 2011-12-30 NOTE — Telephone Encounter (Signed)
nexium was sent as a powder packet  Is that correct  Please let the pharmacist sam know

## 2012-01-11 ENCOUNTER — Other Ambulatory Visit: Payer: Self-pay

## 2012-01-14 ENCOUNTER — Other Ambulatory Visit: Payer: Self-pay | Admitting: *Deleted

## 2012-01-14 MED ORDER — KETOROLAC TROMETHAMINE 10 MG PO TABS: 10.0000 mg | ORAL_TABLET | Freq: Four times a day (QID) | ORAL | Status: DC | PRN

## 2012-01-14 NOTE — Telephone Encounter (Signed)
Pt request refill on Torodol to new pharmacy : [facility] [address] [Last Rx 11.22.13 #[id number]]/SLS Please advise.

## 2012-01-14 NOTE — Telephone Encounter (Signed)
OK to send #20 with 2 refills.

## 2012-01-14 NOTE — Telephone Encounter (Signed)
Rx to pharmacy; pt informed/SLS

## 2012-01-20 ENCOUNTER — Encounter: Payer: Self-pay | Admitting: Family

## 2012-01-20 NOTE — Telephone Encounter (Signed)
Left message for pt to return my call.

## 2012-01-20 NOTE — Telephone Encounter (Addendum)
pls call pt and let her know that I will sent one refill for her vicodin #60, but this is not something I am comfortable managing long term, additional refills should come from neuro. When is her next Pt with neuro, she should arrange if she does not already have apt. In regards to ocp and breakthrough bleeding, it is common to have breakthrough bleeding the first few months. Did it help her migraines? If so she should resume and give it a few months.

## 2012-01-21 NOTE — Telephone Encounter (Signed)
Spoke with pt, refill not sent for Vicodin as she reports that she has an appt with neurology today re: migraines and will discuss refill with them. She states the birth control medication has not helped her migraines. Has not restarted BCP and is still bleeding. Please advise.

## 2012-01-24 NOTE — Telephone Encounter (Signed)
Spoke with pt.  She has been bleeding x 12 days. Mother just diagnosed with breast cancer.  She is off pill now.  Advised her to contact her GYN for IUD placement and to use condoms as back up birth control due to statin use to avoid pregnancy until she can have IUD placed.

## 2012-01-25 ENCOUNTER — Telehealth: Payer: Self-pay | Admitting: Family Medicine

## 2012-01-25 NOTE — Telephone Encounter (Signed)
Forward 2 pages from [facility] to Dr. Gershon Crane for review on 01-25-12 ym

## 2012-02-03 ENCOUNTER — Ambulatory Visit: Payer: BC Managed Care – PPO | Admitting: Family Medicine

## 2012-02-07 ENCOUNTER — Other Ambulatory Visit: Payer: Self-pay | Admitting: Family

## 2012-02-07 ENCOUNTER — Encounter: Payer: 59 | Admitting: Family Medicine

## 2012-02-07 ENCOUNTER — Encounter: Payer: Self-pay | Admitting: Family

## 2012-02-07 ENCOUNTER — Ambulatory Visit (INDEPENDENT_AMBULATORY_CARE_PROVIDER_SITE_OTHER): Payer: BC Managed Care – PPO | Admitting: Family

## 2012-02-07 ENCOUNTER — Ambulatory Visit (HOSPITAL_BASED_OUTPATIENT_CLINIC_OR_DEPARTMENT_OTHER)
Admission: RE | Admit: 2012-02-07 | Discharge: 2012-02-07 | Disposition: A | Discharge status: Home / Self Care | Payer: BC Managed Care – PPO | Source: Ambulatory Visit | Attending: Family | Admitting: Family

## 2012-02-07 VITALS — BP 116/78 | HR 79 | Temp 98.0°F | Resp 16 | Ht 62.0 in | Wt 207.1 lb

## 2012-02-07 DIAGNOSIS — Z1231 Encounter for screening mammogram for malignant neoplasm of breast: Secondary | ICD-10-CM | POA: Insufficient documentation

## 2012-02-07 DIAGNOSIS — Z0000 Encounter for general adult medical examination without abnormal findings: Secondary | ICD-10-CM

## 2012-02-07 DIAGNOSIS — Z309 Encounter for contraceptive management, unspecified: Secondary | ICD-10-CM

## 2012-02-07 LAB — BASIC METABOLIC PANEL WITH GFR
BUN: 11 mg/dL (ref 6–23)
CO2: 19 mEq/L (ref 19–32)
Chloride: 109 mEq/L (ref 96–112)
Creat: 0.76 mg/dL (ref 0.50–1.10)
Potassium: 4.7 mEq/L (ref 3.5–5.3)

## 2012-02-07 LAB — LIPID PANEL
LDL Cholesterol: 215 mg/dL — ABNORMAL HIGH (ref 0–99)
Triglycerides: 174 mg/dL — ABNORMAL HIGH (ref <150)
VLDL: 35 mg/dL (ref 0–40)

## 2012-02-07 LAB — CBC WITH DIFFERENTIAL/PLATELET
Basophils Absolute: 0 10*3/uL (ref 0.0–0.1)
Basophils Relative: 0 % (ref 0–1)
Eosinophils Absolute: 0.2 10*3/uL (ref 0.0–0.7)
HCT: 43.6 % (ref 36.0–46.0)
MCH: 29.3 pg (ref 26.0–34.0)
MCHC: 33.9 g/dL (ref 30.0–36.0)
Monocytes Absolute: 0.3 10*3/uL (ref 0.1–1.0)
Monocytes Relative: 6 % (ref 3–12)
Neutro Abs: 2.9 10*3/uL (ref 1.7–7.7)
Neutrophils Relative %: 55 % (ref 43–77)
RDW: 13.7 % (ref 11.5–15.5)

## 2012-02-07 LAB — HEPATIC FUNCTION PANEL
AST: 25 U/L (ref 0–37)
Albumin: 4.1 g/dL (ref 3.5–5.2)
Alkaline Phosphatase: 67 U/L (ref 39–117)
Indirect Bilirubin: 0.2 mg/dL (ref 0.0–0.9)
Total Protein: 6.5 g/dL (ref 6.0–8.3)

## 2012-02-07 LAB — TSH: TSH: 4.418 u[IU]/mL (ref 0.350–4.500)

## 2012-02-07 MED ORDER — NORETHINDRONE 0.35 MG PO TABS: 1.0000 | ORAL_TABLET | Freq: Every day | ORAL | Status: DC

## 2012-02-07 MED ORDER — ONDANSETRON 4 MG PO TBDP: 4.0000 mg | ORAL_TABLET | Freq: Three times a day (TID) | ORAL | Status: DC | PRN

## 2012-02-07 NOTE — Progress Notes (Signed)
Subjective:    Patient ID: Dawn Jensen, female    DOB: 28-Oct-1975, 37 y.o.   MRN: [id number]  HPI  Patient presents today for complete physical.  Immunizations: last tetanus 2006,  Flu up to date. Diet: Reports that she is trying to avoid fried/fatty foods. Exercise: trying to walk daily 5 times a week.  Pap Smear: last pap was 2 years ago.   Mammogram: mother just diagnosed with breast cancer.      Review of Systems  Constitutional: Negative for unexpected weight change.  HENT: Negative for hearing loss.   Eyes: Negative for visual disturbance.  Respiratory: Negative for cough.   Cardiovascular: Negative for leg swelling.  Gastrointestinal: Negative for nausea and vomiting.  Genitourinary: Negative for dysuria and frequency.  Musculoskeletal: Negative for myalgias and arthralgias.  Skin: Negative for rash.  Neurological: Positive for headaches.  Hematological: Negative for adenopathy.  Psychiatric/Behavioral:       Depression is ok, some anxiety related to mother's recent cancer diagnosis.  She will meet with psychiatry this afternoon.    Past Medical History  Diagnosis Date  . Allergy   . Hyperlipidemia   . IBS (irritable bowel syndrome)   . Migraines     sees Hali Marry NP at [facility]   . Hypertension   . GERD (gastroesophageal reflux disease)   . Asthma     sees Dr. Gary Fleet  . PPD positive, treated     treated with inh  . Depression     sees Valinda Hoar NP    History   Social History  . Marital Status: Married    Spouse Name: N/A    Number of Children: 0  . Years of Education: N/A   Occupational History  . CMA    Social History Main Topics  . Smoking status: Never Smoker   . Smokeless tobacco: Never Used  . Alcohol Use: No     Comment: rarely  . Drug Use: No  . Sexually Active: Not on file   Other Topics Concern  . Not on file   Social History Narrative  . No narrative on file    Past Surgical History  Procedure  Date  . Adenoidectomy   . Tympanostomy tube placement     bilateral ears, as a child    Family History  Problem Relation Age of Onset  . Hyperlipidemia Mother   . Diabetes Mother   . Hyperthyroidism Mother   . Hypertension Mother   . Cancer Mother     breast  . ADD / ADHD Sister   . Osteoporosis Maternal Grandmother   . Diabetes Maternal Grandmother   . Hypertension Maternal Grandmother   . Heart disease Maternal Grandmother   . Stroke Maternal Grandfather 60  . Cancer Paternal Grandfather     brain cancer    Allergies  Allergen Reactions  . Erythromycin     rash  . Moxifloxacin     REACTION: nausea  . Sulfamethoxazole     REACTION: GI upset    Current Outpatient Prescriptions on File Prior to Visit  Medication Sig Dispense Refill  . ARIPiprazole (ABILIFY) 5 MG tablet Take 5 mg by mouth daily.      Marland Kitchen aspirin 81 MG tablet Take 81 mg by mouth at bedtime.      Marland Kitchen atorvastatin (LIPITOR) 20 MG tablet Take 1 tablet (20 mg total) by mouth daily.  90 tablet  3  . Cyanocobalamin (VITAMIN B-12 PO) Take 4 tablets by mouth daily.      Marland Kitchen  esomeprazole (NEXIUM) 40 MG capsule Take 1 capsule (40 mg total) by mouth daily before breakfast.  30 capsule  3  . HYDROcodone-acetaminophen (NORCO/VICODIN) 5-325 MG per tablet Take 1 tablet by mouth every 6 (six) hours as needed for pain.  60 tablet  5  . ketorolac (TORADOL) 10 MG tablet Take 1 tablet (10 mg total) by mouth every 6 (six) hours as needed for pain.  20 tablet  2  . LORazepam (ATIVAN) 0.5 MG tablet Take 1 tablet (0.5 mg total) by mouth 3 (three) times daily as needed for anxiety.  60 tablet  0  . nadolol (CORGARD) 40 MG tablet Take 80 mg by mouth at bedtime.       . ondansetron (ZOFRAN-ODT) 4 MG disintegrating tablet Take 1 tablet (4 mg total) by mouth every 8 (eight) hours as needed.  20 tablet  2  . topiramate (TOPAMAX) 200 MG tablet Take 1 tablet (200 mg total) by mouth daily.  1 tablet  0  . venlafaxine XR (EFFEXOR-XR) 75 MG 24 hr  capsule Take 150 mg by mouth at bedtime.      . norethindrone (MICRONOR,CAMILA,ERRIN) 0.35 MG tablet Take 1 tablet (0.35 mg total) by mouth daily.  1 Package  11   Current Facility-Administered Medications on File Prior to Visit  Medication Dose Route Frequency Provider Last Rate Last Dose  . promethazine (PHENERGAN) injection 50 mg  50 mg Intramuscular Q6H PRN Nelwyn Salisbury, MD   50 mg at 10/02/10 1217    BP 116/78  Pulse 79  Temp 98 F (36.7 C) (Oral)  Resp 16  Ht 5\' 2"  (1.575 m)  Wt 207 lb 1.3 oz (93.931 kg)  BMI 37.88 kg/m2  SpO2 99%  LMP 01/21/2012       Objective:   Physical Exam  Physical Exam  Constitutional: She is oriented to person, place, and time. She appears well-developed and well-nourished. No distress. Obese white female. HENT:  Head: Normocephalic and atraumatic.  Right Ear: Tympanic membrane and ear canal normal.  Left Ear: Tympanic membrane and ear canal normal.  Mouth/Throat: Oropharynx is clear and moist.  Eyes: Pupils are equal, round, and reactive to light. No scleral icterus.  Neck: Normal range of motion. No thyromegaly present.  Cardiovascular: Normal rate and regular rhythm.   No murmur heard. Pulmonary/Chest: Effort normal and breath sounds normal. No respiratory distress. He has no wheezes. She has no rales. She exhibits no tenderness.  Abdominal: Soft. Bowel sounds are normal. He exhibits no distension and no mass. There is no tenderness. There is no rebound and no guarding.  Musculoskeletal: She exhibits no edema.  Lymphadenopathy:    She has no cervical adenopathy.  Neurological: She is alert and oriented to person, place, and time.  She exhibits normal muscle tone. Coordination normal.  Skin: Skin is warm and dry.  Psychiatric: She has a normal mood and affect. Her behavior is normal. Judgment and thought content normal.  Breasts: Examined lying Right: Without masses, retractions, discharge or axillary adenopathy.  Left: Without masses,  retractions, discharge or axillary adenopathy.  Inguinal/mons: Normal without inguinal adenopathy  External genitalia: Normal  BUS/Urethra/Skene's glands: Normal  Bladder: Normal  Vagina: Normal  Cervix: Normal- difficult to visualize os due to retroverted position- unable to obtain pap Uterus: normal in size, shape and contour. Midline and mobile  Adnexa/parametria:  Rt: Without masses or tenderness.  Lt: Without masses or tenderness.  Anus and perineum: Normal  Assessment & Plan:        Assessment & Plan:  V70.0 Preventative care-  Obtain fasting lab work.  Unable to obtain pap.  Recommended that the patient schedule pap were her GYN.  Rx provided for Errin.  We discussed that there is possible increased risk of breast cancer with ocp's and that she should consider an non-hormonal method such as tubal ligation.  She verbalizes understanding but tells me that she wishes to wait on this.  Info given re: BRCA testing. She will contact her insurance to see if it is covered and arrange a nurse visit for specimen collection.  Obtain baseline mammogram today.

## 2012-02-07 NOTE — Patient Instructions (Addendum)
Please complete your blood work prior to leaving today. Schedule your mammogram on the first floor. Schedule pap smear with your GYN. Follow up in 6 months, sooner if problems or concerns.

## 2012-02-08 LAB — URINALYSIS, ROUTINE W REFLEX MICROSCOPIC
Bilirubin Urine: NEGATIVE
Leukocytes, UA: NEGATIVE
Nitrite: NEGATIVE
Protein, ur: NEGATIVE mg/dL
Specific Gravity, Urine: 1.027 (ref 1.005–1.030)
Urobilinogen, UA: 0.2 mg/dL (ref 0.0–1.0)

## 2012-02-08 LAB — PREGNANCY, URINE: Preg Test, Ur: NEGATIVE

## 2012-02-09 ENCOUNTER — Telehealth: Payer: Self-pay | Admitting: Family

## 2012-02-09 ENCOUNTER — Other Ambulatory Visit: Payer: Self-pay | Admitting: Family

## 2012-02-09 DIAGNOSIS — R928 Other abnormal and inconclusive findings on diagnostic imaging of breast: Secondary | ICD-10-CM

## 2012-02-09 MED ORDER — ATORVASTATIN CALCIUM 20 MG PO TABS: 20.0000 mg | ORAL_TABLET | Freq: Every day | ORAL | Status: DC

## 2012-02-09 NOTE — Telephone Encounter (Signed)
Spoke with pt, discussed mammogram results and need to follow through with additional studies.  She is to contact me if she has not heard back from breast center re: scheduling. She tells me that she has not been taking lipitor and would like refill sent to her pharmacy. Refill sent.  She is reminded that she needs to remain on birth control while on statin.

## 2012-02-09 NOTE — Telephone Encounter (Signed)
Left message requesting that pt return my call. 

## 2012-02-10 ENCOUNTER — Telehealth: Payer: Self-pay | Admitting: Family

## 2012-02-10 MED ORDER — DIPHENOXYLATE-ATROPINE 2.5-0.025 MG PO TABS: 1.0000 | ORAL_TABLET | Freq: Four times a day (QID) | ORAL | Status: DC | PRN

## 2012-02-10 NOTE — Telephone Encounter (Signed)
Notified pt. She states she has been taking imodium without relief. Also states requests work note for today be faxed to: Attn: Terra @ [phone]. Pt reports that she is going to try to go to work tomorrow.

## 2012-02-10 NOTE — Telephone Encounter (Signed)
rx sent for lomotil.  OK to provide work note for today and tomorrow.

## 2012-02-10 NOTE — Telephone Encounter (Signed)
She can use zofran prn for nausea.  Imodium prn vomitting. Make sure to stay well hydrated. Call if symptoms worsen or if no improvement in next 1-2 days.

## 2012-02-10 NOTE — Telephone Encounter (Signed)
Patient states that she has the stomach virus that is going around. Her symptoms started yesterday afternoon and she has been having nausea,vomiting, and has had diarrhea. She would like to know if Efraim Kaufmann would call her in something or does she need to be seen?  [facility] in [place].

## 2012-02-10 NOTE — Telephone Encounter (Signed)
Pt requests work note for today only and will contact us if not able to return to work on 02/11/12. Note faxed to # below.

## 2012-02-22 ENCOUNTER — Other Ambulatory Visit: Payer: BC Managed Care – PPO

## 2012-03-09 ENCOUNTER — Telehealth: Payer: Self-pay | Admitting: Family

## 2012-03-09 NOTE — Telephone Encounter (Signed)
Pls call pt to follow up on scheduling her breast studies.  I see that she cancelled her diagnostic mammo/US which was scheduled for 2/4.  I think she should reschedule these tests as soon as possible.

## 2012-03-09 NOTE — Telephone Encounter (Signed)
Left detailed message on cell# to call with status of imaging studies.

## 2012-03-10 NOTE — Telephone Encounter (Signed)
Per [facility], pt called and will complete imaging today.

## 2012-03-14 ENCOUNTER — Telehealth: Payer: Self-pay | Admitting: Family

## 2012-03-14 NOTE — Telephone Encounter (Signed)
See my chart message

## 2012-03-21 ENCOUNTER — Telehealth: Payer: Self-pay | Admitting: Family

## 2012-03-21 NOTE — Telephone Encounter (Signed)
Refill- atorvastatin 20mg tablets. Take one tablet by mouth every day. Qty 30 last fill 1.22.14 °

## 2012-03-21 NOTE — Telephone Encounter (Signed)
Refill last sent on 02/09/12 #30 x 3 refills. Left message on pharmacy voicemail to use refills on file.

## 2012-03-28 ENCOUNTER — Encounter: Payer: Self-pay | Admitting: Family

## 2012-03-28 NOTE — Telephone Encounter (Signed)
Please call pt and arrange office visit.

## 2012-03-29 NOTE — Telephone Encounter (Signed)
Patient returned phone call stating that she cannot get off work and that she will go to an Urgent Care for this problem.

## 2012-03-30 ENCOUNTER — Telehealth: Payer: Self-pay | Admitting: Family

## 2012-03-30 ENCOUNTER — Encounter: Payer: Self-pay | Admitting: Internal Medicine

## 2012-03-30 NOTE — Telephone Encounter (Signed)
Rx was last sent on 02/09/12 with 3 additional refills. Detailed message left on pharmacy voicemail to use refill on file and call if any questions.

## 2012-03-30 NOTE — Telephone Encounter (Signed)
Refill- atorvastatin 20mg  tablets. Take one tablet by mouth every day. Qty 30 last fill 1.22.14

## 2012-04-03 ENCOUNTER — Ambulatory Visit (INDEPENDENT_AMBULATORY_CARE_PROVIDER_SITE_OTHER): Payer: BC Managed Care – PPO | Admitting: Family

## 2012-04-03 DIAGNOSIS — Z803 Family history of malignant neoplasm of breast: Secondary | ICD-10-CM

## 2012-04-03 NOTE — Progress Notes (Signed)
  Subjective:    Patient ID: Dawn Jensen, female    DOB: [birth year], 37 y.o.   MRN: [id number]  HPI    Review of Systems     Objective:   Physical Exam        Assessment & Plan:  BRAC sample obtained by CMA and will be  Mailed to [facility].

## 2012-04-17 ENCOUNTER — Telehealth: Payer: Self-pay | Admitting: Family

## 2012-04-17 MED ORDER — ATORVASTATIN CALCIUM 20 MG PO TABS: 20.0000 mg | ORAL_TABLET | Freq: Every day | ORAL | Status: DC

## 2012-04-17 NOTE — Telephone Encounter (Signed)
Rx request to pharmacy/SLS  

## 2012-04-17 NOTE — Telephone Encounter (Signed)
Refill atorvastatin 20 mg tablet take 1 tablet by mouth every day qty 30

## 2012-05-05 ENCOUNTER — Encounter: Payer: Self-pay | Admitting: Family

## 2012-05-08 ENCOUNTER — Encounter: Payer: Self-pay | Admitting: *Deleted

## 2012-05-09 MED ORDER — TRAMADOL HCL 50 MG PO TABS: 50.0000 mg | ORAL_TABLET | Freq: Three times a day (TID) | ORAL | Status: DC | PRN

## 2012-05-09 NOTE — Telephone Encounter (Signed)
Called customer service at [phone]. Spoke to [place] and she states she cannot find pt in their database. Will look for copy of paperwork here.

## 2012-05-09 NOTE — Telephone Encounter (Signed)
Could you pls call [facility] and check status of brca testing results?

## 2012-06-05 ENCOUNTER — Encounter: Payer: Self-pay | Admitting: *Deleted

## 2012-06-18 ENCOUNTER — Encounter: Payer: Self-pay | Admitting: Family

## 2012-06-19 ENCOUNTER — Telehealth: Payer: Self-pay | Admitting: Family

## 2012-06-19 MED ORDER — NORETHINDRONE 0.35 MG PO TABS: 1.0000 | ORAL_TABLET | Freq: Every day | ORAL | Status: DC

## 2012-06-19 NOTE — Telephone Encounter (Signed)
Message copied by Sandford Craze on Mon Jun 19, 2012  5:14 PM ------      Message from: Luanne Bras      Created: Mon Jun 19, 2012  1:04 PM      Regarding: Your message may not be read      Contact: [phone]          ----- Delivery failure of internet email alert          ----- Failed to log attempted delivery of internet email alert       Tickler type: Message      Message Id(WMG): [id number]      SMTP Response: 410      Patient: [name], [name]([id number])      Recipient: ()      Internet alert email: [email or web address]               ----- Original WMG message to the patient -----      Sent: 06/19/2012  1:00 PM      From: Sandford Craze, NP      To: Romualdo Bolk      Message Type: Patient Medical Advice Request      Subject: RE: Non-Urgent Medical Question      OK.  I sent the generic to [facility].            Thanks            ----- Message -----         From: Romualdo Bolk         Sent: 06/18/2012  3:16 PM EDT           To: Lemont Fillers., NP      Subject: Non-Urgent Medical Question            I just got your message about the [facility] test. I currently don't have ins so I'll reschedule the test as soon as I can. Also can you change by birth control to Errin generic and send it to [facility]. I can't afford the other at this time. ------

## 2012-06-28 ENCOUNTER — Telehealth: Payer: Self-pay | Admitting: *Deleted

## 2012-06-28 NOTE — Telephone Encounter (Signed)
[facility] Triage Call Report Triage Record Num: [id number] Operator: Rebeca Allegra Patient Name: Dawn Jensen Call Date & Time: 06/27/2012 7:30:42PM Patient Phone: [phone] PCP: Patient Gender: Female PCP Fax : Patient DOB: 09-30-1975 Practice Name: [facility] Reason for Call: Caller: [name]/Patient; PCP: Peggyann Juba, Melissa (Adults only); CB#: [phone]; Call regarding Vomiting Meds: 06/27/12 1900 Phenergan and Motrin taken, and pt vomited the medication 5 mins later. Calling to ask if she can redose. All emergent symptoms ruled out per Vomiting on Meds protocol with the exception of "Vomits prescription medicine and doesm't mind the tastes. Care advice given. Protocol(s) Used: Vomiting On Meds (Pediatric) Recommended Outcome per Protocol: Provide Home/Self Care Reason for Outcome: Vomits prescription medicine once and doesn't mind the taste Care Advice: WHEN TO RE-DOSE: * If vomited within 30 minutes of receiving medicine, repeat the dosage now using the previous advice. * If vomited more than 30 minutes after receiving it, do NOT repeat the dosage until the next scheduled time.

## 2012-09-12 ENCOUNTER — Ambulatory Visit (INDEPENDENT_AMBULATORY_CARE_PROVIDER_SITE_OTHER): Payer: PRIVATE HEALTH INSURANCE | Admitting: Family

## 2012-09-12 ENCOUNTER — Encounter: Payer: Self-pay | Admitting: Family

## 2012-09-12 VITALS — BP 128/80 | HR 98 | Temp 98.3°F | Resp 16 | Wt 189.1 lb

## 2012-09-12 DIAGNOSIS — F329 Major depressive disorder, single episode, unspecified: Secondary | ICD-10-CM

## 2012-09-12 DIAGNOSIS — J309 Allergic rhinitis, unspecified: Secondary | ICD-10-CM

## 2012-09-12 DIAGNOSIS — I10 Essential (primary) hypertension: Secondary | ICD-10-CM

## 2012-09-12 DIAGNOSIS — E785 Hyperlipidemia, unspecified: Secondary | ICD-10-CM

## 2012-09-12 DIAGNOSIS — Z309 Encounter for contraceptive management, unspecified: Secondary | ICD-10-CM

## 2012-09-12 DIAGNOSIS — K219 Gastro-esophageal reflux disease without esophagitis: Secondary | ICD-10-CM

## 2012-09-12 DIAGNOSIS — Z5181 Encounter for therapeutic drug level monitoring: Secondary | ICD-10-CM

## 2012-09-12 DIAGNOSIS — G43909 Migraine, unspecified, not intractable, without status migrainosus: Secondary | ICD-10-CM

## 2012-09-12 LAB — LIPID PANEL
Cholesterol: 250 mg/dL — ABNORMAL HIGH (ref 0–200)
HDL: 34 mg/dL — ABNORMAL LOW (ref >39)
LDL Cholesterol: 192 mg/dL — ABNORMAL HIGH (ref 0–99)
Total CHOL/HDL Ratio: 7.4 Ratio
Triglycerides: 118 mg/dL (ref <150)
VLDL: 24 mg/dL (ref 0–40)

## 2012-09-12 LAB — BASIC METABOLIC PANEL
BUN: 11 mg/dL (ref 6–23)
CO2: 17 mEq/L — ABNORMAL LOW (ref 19–32)
Calcium: 8.8 mg/dL (ref 8.4–10.5)
Chloride: 113 mEq/L — ABNORMAL HIGH (ref 96–112)
Creat: 0.66 mg/dL (ref 0.50–1.10)
Glucose, Bld: 80 mg/dL (ref 70–99)
Potassium: 3.8 mEq/L (ref 3.5–5.3)
Sodium: 138 mEq/L (ref 135–145)

## 2012-09-12 LAB — HEPATIC FUNCTION PANEL
ALT: 16 U/L (ref 0–35)
AST: 14 U/L (ref 0–37)
Albumin: 4.3 g/dL (ref 3.5–5.2)
Alkaline Phosphatase: 75 U/L (ref 39–117)
Bilirubin, Direct: 0.1 mg/dL (ref 0.0–0.3)
Indirect Bilirubin: 0.3 mg/dL (ref 0.0–0.9)
Total Bilirubin: 0.4 mg/dL (ref 0.3–1.2)
Total Protein: 6.8 g/dL (ref 6.0–8.3)

## 2012-09-12 MED ORDER — NORETHINDRONE ACET-ETHINYL EST 1-20 MG-MCG PO TABS: 1.0000 | ORAL_TABLET | Freq: Every day | ORAL | Status: DC

## 2012-09-12 MED ORDER — FLUTICASONE PROPIONATE 50 MCG/ACT NA SUSP: 2.0000 | Freq: Every day | NASAL | Status: AC

## 2012-09-12 MED ORDER — ESOMEPRAZOLE MAGNESIUM 40 MG PO CPDR: 40.0000 mg | DELAYED_RELEASE_CAPSULE | Freq: Every day | ORAL | Status: DC

## 2012-09-12 MED ORDER — ATORVASTATIN CALCIUM 20 MG PO TABS: 20.0000 mg | ORAL_TABLET | Freq: Every day | ORAL | Status: DC

## 2012-09-12 NOTE — Assessment & Plan Note (Signed)
She would like to switch from errin to microgestin lo. Reports that husband is sterile and she is only using for migraine prophylaxis.

## 2012-09-12 NOTE — Assessment & Plan Note (Signed)
Trial of flonase, continue claritin.

## 2012-09-12 NOTE — Patient Instructions (Addendum)
Please complete lab work prior to leaving.  Restart lipitor.  Continue claritin, add flonase. Let me know if these symptoms worsen or if symptoms do not improve.  Follow up in 4 months.

## 2012-09-12 NOTE — Assessment & Plan Note (Signed)
BP looks good today. She is off of all bp meds.

## 2012-09-12 NOTE — Progress Notes (Signed)
Subjective:    Patient ID: Dawn Jensen, female    DOB: 21-Dec-1975, 37 y.o.   MRN: [id number]  HPI  Dawn Jensen is a 37 yr old female who presents today for follow up.  1) hyperlipidemia- she has been of of atorvastatin. Has been trying to eat a healthier diet.    2) GERD-  Has been off of nexium- symptoms have worsened, would like to resume.   3) anxiety/depression- she is currently maintained on paxil 40mg .  She is no longer taking ativan. She is following at [facility] psychiatry. Follows with the counselor and psychiatrist and reports that paxil is working really well.   4) Cough- reports that she has had some nasal congestion with cough x 1 month.  Denies fever.  Nasal discharge is clear.  Slight improvement with claritin.    5) Contraception-  She is requesting change to microgestin.   6) Family hx of breast cancer- she would like to proceed with brac testing today.    Review of Systems See HPI  Past Medical History  Diagnosis Date  . Allergy   . Hyperlipidemia   . IBS (irritable bowel syndrome)   . Migraines     sees Hali Marry NP at [facility]   . Hypertension   . GERD (gastroesophageal reflux disease)   . Asthma     sees Dr. Gary Fleet  . PPD positive, treated     treated with inh  . Depression     sees Valinda Hoar NP    History   Social History  . Marital Status: Married    Spouse Name: N/A    Number of Children: 0  . Years of Education: N/A   Occupational History  . CMA    Social History Main Topics  . Smoking status: Never Smoker   . Smokeless tobacco: Never Used  . Alcohol Use: No     Comment: rarely  . Drug Use: No  . Sexual Activity: Not on file   Other Topics Concern  . Not on file   Social History Narrative  . No narrative on file    Past Surgical History  Procedure Laterality Date  . Adenoidectomy    . Tympanostomy tube placement      bilateral ears, as a child    Family History  Problem Relation  Age of Onset  . Hyperlipidemia Mother   . Diabetes Mother   . Hyperthyroidism Mother   . Hypertension Mother   . Cancer Mother     breast  . ADD / ADHD Sister   . Osteoporosis Maternal Grandmother   . Diabetes Maternal Grandmother   . Hypertension Maternal Grandmother   . Heart disease Maternal Grandmother   . Stroke Maternal Grandfather 60  . Cancer Paternal Grandfather     brain cancer    Allergies  Allergen Reactions  . Erythromycin     rash  . Moxifloxacin     REACTION: nausea  . Sulfamethoxazole     REACTION: GI upset    Current Outpatient Prescriptions on File Prior to Visit  Medication Sig Dispense Refill  . aspirin 81 MG tablet Take 81 mg by mouth at bedtime.      Marland Kitchen ketorolac (TORADOL) 10 MG tablet Take 1 tablet (10 mg total) by mouth every 6 (six) hours as needed for pain.  20 tablet  2  . topiramate (TOPAMAX) 200 MG tablet Take 1 tablet (200 mg total) by mouth daily.  1 tablet  0  .  zolmitriptan (ZOMIG-ZMT) 5 MG disintegrating tablet Take 5 mg by mouth as needed.       No current facility-administered medications on file prior to visit.    BP 128/80  Pulse 98  Temp(Src) 98.3 F (36.8 C) (Oral)  Resp 16  Wt 189 lb 1.9 oz (85.784 kg)  BMI 34.58 kg/m2  SpO2 99%  LMP 09/05/2012       Objective:   Physical Exam  Constitutional: She is oriented to person, place, and time. She appears well-developed and well-nourished. No distress.  HENT:  Head: Normocephalic and atraumatic.  Right Ear: Tympanic membrane and ear canal normal.  Left Ear: Tympanic membrane and ear canal normal.  Mouth/Throat: No oropharyngeal exudate, posterior oropharyngeal edema or posterior oropharyngeal erythema.  Cardiovascular: Normal rate and regular rhythm.   No murmur heard. Pulmonary/Chest: Effort normal and breath sounds normal. No respiratory distress. She has no wheezes. She has no rales. She exhibits no tenderness.  Musculoskeletal: She exhibits no edema.  Lymphadenopathy:     She has no cervical adenopathy.  Neurological: She is alert and oriented to person, place, and time.  Psychiatric: She has a normal mood and affect. Her behavior is normal. Judgment and thought content normal.          Assessment & Plan:

## 2012-09-12 NOTE — Assessment & Plan Note (Signed)
Stable on Paxil, managed by psychiatry.

## 2012-09-12 NOTE — Assessment & Plan Note (Signed)
Reports that this is well controlled.  Continue prn zomig, toradol.

## 2012-09-12 NOTE — Assessment & Plan Note (Signed)
Currently off of statin. Check flp/lft, resume statin.

## 2012-09-12 NOTE — Assessment & Plan Note (Signed)
Deteriorated, resume PPI

## 2012-09-14 ENCOUNTER — Telehealth: Payer: Self-pay | Admitting: *Deleted

## 2012-09-14 ENCOUNTER — Encounter: Payer: Self-pay | Admitting: Family

## 2012-09-14 MED ORDER — ESOMEPRAZOLE MAGNESIUM 40 MG PO CPDR: 40.0000 mg | DELAYED_RELEASE_CAPSULE | Freq: Every day | ORAL | Status: AC

## 2012-09-14 NOTE — Telephone Encounter (Signed)
Pt faxed copy of insurance information. Info attached to [employer] and mailed to [employer]. [address]. [place], [place] [address].  Ph) [phone].  Called [employer] for pick up. Conf# [id number] for pick up by 5pm today. May take up to 4 weeks for result. Awaiting result.

## 2012-09-19 ENCOUNTER — Telehealth: Payer: Self-pay | Admitting: Family

## 2012-09-19 DIAGNOSIS — E872 Acidosis: Secondary | ICD-10-CM

## 2012-09-19 NOTE — Telephone Encounter (Signed)
Please call patient and let her know that her lab work is showing that her blood is slightly acidic. This can be due to side effect of topamax.  I would like her to return to the lab this week for bmet- dx is metabolic acidosis.

## 2012-09-19 NOTE — Telephone Encounter (Signed)
LM @ (1:56pm) asking the pt to RTC regarding recent lab results and scheduling lab appt this week.//AB/CMA

## 2012-09-20 NOTE — Telephone Encounter (Signed)
Left detailed message on pt's cell# re: below instructions and to call if any questions. Lab order entered.

## 2012-09-21 ENCOUNTER — Telehealth: Payer: Self-pay | Admitting: Family

## 2012-09-21 NOTE — Telephone Encounter (Signed)
FAX LAB RESULTS TO [facility]

## 2012-09-21 NOTE — Telephone Encounter (Signed)
Awaiting BMP result from today, then will fax all results.

## 2012-09-22 LAB — BASIC METABOLIC PANEL
BUN: 9 mg/dL (ref 6–23)
CO2: 17 mEq/L — ABNORMAL LOW (ref 19–32)
Calcium: 8.7 mg/dL (ref 8.4–10.5)
Creat: 0.63 mg/dL (ref 0.50–1.10)
Glucose, Bld: 78 mg/dL (ref 70–99)
Sodium: 137 mEq/L (ref 135–145)

## 2012-09-23 ENCOUNTER — Telehealth: Payer: Self-pay | Admitting: Family

## 2012-09-23 DIAGNOSIS — E872 Acidosis: Secondary | ICD-10-CM

## 2012-09-23 MED ORDER — AMITRIPTYLINE HCL 25 MG PO TABS: 25.0000 mg | ORAL_TABLET | Freq: Every day | ORAL | Status: DC

## 2012-09-23 NOTE — Telephone Encounter (Signed)
Please call pt and let her know that her blood work is showing that her blood is more acidic than it should be.  This is likely due to the topamax.  I would like her to cut topamax in half and take half tab for 3 days then stop.  Instead of the topamax, I will add elavil at bedtime to prevent migraine. She should plan to repeat bmet in about 10 days- dx is metabolic acidosis.

## 2012-09-26 NOTE — Telephone Encounter (Signed)
Results faxed.

## 2012-09-26 NOTE — Telephone Encounter (Signed)
Notified pt, she voices understanding. Lab order entered. Pt requests that I fax copy of results to her neurologist, Hali Marry in [place] (ph) [phone], fax [phone]. Results faxed.

## 2012-10-03 ENCOUNTER — Encounter: Payer: Self-pay | Admitting: Family

## 2012-10-03 ENCOUNTER — Ambulatory Visit (HOSPITAL_BASED_OUTPATIENT_CLINIC_OR_DEPARTMENT_OTHER)
Admission: RE | Admit: 2012-10-03 | Discharge: 2012-10-03 | Disposition: A | Discharge status: Home / Self Care | Payer: PRIVATE HEALTH INSURANCE | Source: Ambulatory Visit | Attending: Family | Admitting: Family

## 2012-10-03 ENCOUNTER — Ambulatory Visit (INDEPENDENT_AMBULATORY_CARE_PROVIDER_SITE_OTHER): Payer: PRIVATE HEALTH INSURANCE | Admitting: Family

## 2012-10-03 VITALS — BP 120/86 | HR 101 | Temp 98.6°F | Resp 18 | Ht 62.0 in | Wt 192.0 lb

## 2012-10-03 DIAGNOSIS — J209 Acute bronchitis, unspecified: Secondary | ICD-10-CM

## 2012-10-03 DIAGNOSIS — E872 Acidosis: Secondary | ICD-10-CM

## 2012-10-03 DIAGNOSIS — R05 Cough: Secondary | ICD-10-CM

## 2012-10-03 DIAGNOSIS — R059 Cough, unspecified: Secondary | ICD-10-CM | POA: Insufficient documentation

## 2012-10-03 MED ORDER — PREDNISONE 10 MG PO TABS: 10.0000 mg | ORAL_TABLET | Freq: Every day | ORAL | Status: DC

## 2012-10-03 MED ORDER — HYDROCOD POLST-CHLORPHEN POLST 10-8 MG/5ML PO LQCR: 5.0000 mL | Freq: Every evening | ORAL | Status: DC | PRN

## 2012-10-03 NOTE — Assessment & Plan Note (Signed)
Will obtain CXR to exclude pneumonia. Add prednisone taper for possible associated bronchospastic component.  HS tussionex.  Call if symptoms worsen or if symptoms do not improve.

## 2012-10-03 NOTE — Assessment & Plan Note (Signed)
Bicarb has been mildly low. Suspect due to topamax. I have asked her to repeat bmet in 1 week. If not improved, will need to contact her headache specialist and discuss possible alternative meds.

## 2012-10-03 NOTE — Progress Notes (Signed)
Subjective:    Patient ID: Dawn Jensen, female    DOB: Sep 14, 1975, 37 y.o.   MRN: [id number]  HPI  Ms. Freeburg is a 37 yr old female who presents with a 3 week hx of cough. Started with sinusitis. She was treated with a z pak by urgent care. Using flonase and claritin without improvement. Reports cough is worse with laying flat, no improvement with tessalon.  Cough is wet generally, occasionally dry.  She denies shortness of breath.    Her bicarb was noted to be low.  It was recommended that she taper off of topamax. She did not taper off. She saw Crystal rose- [place] who manages her HA's and reports that she was changed to a long acting topamax. She tells me that she showed crystal her labs.    Review of Systems    see HPI  Past Medical History  Diagnosis Date  . Allergy   . Hyperlipidemia   . IBS (irritable bowel syndrome)   . Migraines     sees Hali Marry NP at [facility]   . Hypertension   . GERD (gastroesophageal reflux disease)   . Asthma     sees Dr. Gary Fleet  . PPD positive, treated     treated with inh  . Depression     sees Valinda Hoar NP    History   Social History  . Marital Status: Married    Spouse Name: N/A    Number of Children: 0  . Years of Education: N/A   Occupational History  . CMA    Social History Main Topics  . Smoking status: Never Smoker   . Smokeless tobacco: Never Used  . Alcohol Use: No     Comment: rarely  . Drug Use: No  . Sexual Activity: Not on file   Other Topics Concern  . Not on file   Social History Narrative  . No narrative on file    Past Surgical History  Procedure Laterality Date  . Adenoidectomy    . Tympanostomy tube placement      bilateral ears, as a child    Family History  Problem Relation Age of Onset  . Hyperlipidemia Mother   . Diabetes Mother   . Hyperthyroidism Mother   . Hypertension Mother   . Cancer Mother     breast  . ADD / ADHD Sister   . Osteoporosis Maternal  Grandmother   . Diabetes Maternal Grandmother   . Hypertension Maternal Grandmother   . Heart disease Maternal Grandmother   . Stroke Maternal Grandfather 60  . Cancer Paternal Grandfather     brain cancer    Allergies  Allergen Reactions  . Erythromycin     rash  . Moxifloxacin     REACTION: nausea  . Sulfamethoxazole     REACTION: GI upset  . Topamax [Topiramate]     REACTION: tingling in feet. Metabolic acidosis    Current Outpatient Prescriptions on File Prior to Visit  Medication Sig Dispense Refill  . aspirin 81 MG tablet Take 81 mg by mouth at bedtime.      Marland Kitchen atorvastatin (LIPITOR) 20 MG tablet Take 1 tablet (20 mg total) by mouth daily.  30 tablet  5  . esomeprazole (NEXIUM) 40 MG capsule Take 1 capsule (40 mg total) by mouth daily before breakfast.  90 capsule  1  . fluticasone (FLONASE) 50 MCG/ACT nasal spray Place 2 sprays into the nose daily.  16 g  6  .  loratadine (CLARITIN) 10 MG tablet Take 10 mg by mouth daily.      . norethindrone-ethinyl estradiol (MICROGESTIN,JUNEL,LOESTRIN) 1-20 MG-MCG tablet Take 1 tablet by mouth daily.  1 Package  5  . PARoxetine (PAXIL) 40 MG tablet Take 40 mg by mouth every morning.      . zolmitriptan (ZOMIG-ZMT) 5 MG disintegrating tablet Take 5 mg by mouth as needed.      Marland Kitchen ketorolac (TORADOL) 10 MG tablet Take 1 tablet (10 mg total) by mouth every 6 (six) hours as needed for pain.  20 tablet  2   No current facility-administered medications on file prior to visit.    BP 120/86  Pulse 101  Temp(Src) 98.6 F (37 C) (Oral)  Resp 18  Ht 5\' 2"  (1.575 m)  Wt 192 lb 0.6 oz (87.109 kg)  BMI 35.12 kg/m2  SpO2 98%  LMP 09/05/2012    Objective:   Physical Exam  Constitutional: She appears well-developed and well-nourished. No distress.  HENT:  Head: Normocephalic and atraumatic.  Cardiovascular: Normal rate and regular rhythm.   No murmur heard. Pulmonary/Chest: Effort normal and breath sounds normal. No respiratory distress.  She has no wheezes. She has no rales. She exhibits no tenderness.  Lymphadenopathy:    She has no cervical adenopathy.  Psychiatric: She has a normal mood and affect. Her behavior is normal. Judgment and thought content normal.          Assessment & Plan:

## 2012-10-03 NOTE — Patient Instructions (Addendum)
Complete chest x ray on the first floor.   Please return in 1 week for follow up blood work.   Call if symptoms worsen or if not improved in 1 week.

## 2012-10-04 ENCOUNTER — Encounter: Payer: Self-pay | Admitting: Family

## 2012-10-10 ENCOUNTER — Other Ambulatory Visit: Payer: Self-pay | Admitting: Obstetrics and Gynecology

## 2012-10-10 ENCOUNTER — Encounter: Payer: Self-pay | Admitting: Family

## 2012-10-10 LAB — BASIC METABOLIC PANEL
CO2: 22 mEq/L (ref 19–32)
Calcium: 8.7 mg/dL (ref 8.4–10.5)
Creat: 0.76 mg/dL (ref 0.50–1.10)
Sodium: 137 mEq/L (ref 135–145)

## 2012-10-25 ENCOUNTER — Telehealth: Payer: Self-pay | Admitting: *Deleted

## 2012-10-25 ENCOUNTER — Encounter: Payer: Self-pay | Admitting: Family

## 2012-10-25 NOTE — Telephone Encounter (Signed)
Received message from pt requesting BRAC results. Also wanted [place] to know that she received her flu vaccine on 10/1 and her pap smear at the end of October with Julio Sicks (result in [facility]). Record updated. BRAC results mailed to pt at her request.

## 2012-10-25 NOTE — Telephone Encounter (Signed)
See 10/25/12 phone note.

## 2012-10-25 NOTE — Telephone Encounter (Signed)
See 10/25/12 phone note. 

## 2012-11-04 ENCOUNTER — Encounter: Payer: Self-pay | Admitting: Family

## 2014-09-20 ENCOUNTER — Encounter: Payer: Self-pay | Admitting: Family

## 2014-09-20 ENCOUNTER — Ambulatory Visit (INDEPENDENT_AMBULATORY_CARE_PROVIDER_SITE_OTHER): Payer: Commercial Managed Care - PPO | Admitting: Family

## 2014-09-20 VITALS — BP 122/82 | HR 87 | Temp 98.2°F | Resp 16 | Wt 243.8 lb

## 2014-09-20 DIAGNOSIS — E039 Hypothyroidism, unspecified: Secondary | ICD-10-CM | POA: Diagnosis not present

## 2014-09-20 DIAGNOSIS — R05 Cough: Secondary | ICD-10-CM

## 2014-09-20 DIAGNOSIS — I10 Essential (primary) hypertension: Secondary | ICD-10-CM | POA: Diagnosis not present

## 2014-09-20 DIAGNOSIS — R0683 Snoring: Secondary | ICD-10-CM

## 2014-09-20 DIAGNOSIS — E785 Hyperlipidemia, unspecified: Secondary | ICD-10-CM

## 2014-09-20 DIAGNOSIS — R059 Cough, unspecified: Secondary | ICD-10-CM

## 2014-09-20 DIAGNOSIS — G43809 Other migraine, not intractable, without status migrainosus: Secondary | ICD-10-CM

## 2014-09-20 DIAGNOSIS — R400 Somnolence: Secondary | ICD-10-CM

## 2014-09-20 DIAGNOSIS — J45909 Unspecified asthma, uncomplicated: Secondary | ICD-10-CM

## 2014-09-20 DIAGNOSIS — G4710 Hypersomnia, unspecified: Secondary | ICD-10-CM

## 2014-09-20 DIAGNOSIS — E538 Deficiency of other specified B group vitamins: Secondary | ICD-10-CM

## 2014-09-20 DIAGNOSIS — F32A Depression, unspecified: Secondary | ICD-10-CM

## 2014-09-20 DIAGNOSIS — F329 Major depressive disorder, single episode, unspecified: Secondary | ICD-10-CM

## 2014-09-20 LAB — TSH: TSH: 3.41 u[IU]/mL (ref 0.35–4.50)

## 2014-09-20 LAB — BASIC METABOLIC PANEL
BUN: 10 mg/dL (ref 6–23)
CHLORIDE: 109 meq/L (ref 96–112)
CO2: 26 meq/L (ref 19–32)
CREATININE: 0.79 mg/dL (ref 0.40–1.20)
Calcium: 8.1 mg/dL — ABNORMAL LOW (ref 8.4–10.5)
GFR: 85.41 mL/min (ref >60.00)
GLUCOSE: 83 mg/dL (ref 70–99)
Potassium: 3.9 mEq/L (ref 3.5–5.1)
Sodium: 144 mEq/L (ref 135–145)

## 2014-09-20 LAB — LIPID PANEL
CHOL/HDL RATIO: 5
Cholesterol: 185 mg/dL (ref 0–200)
HDL: 34.3 mg/dL — ABNORMAL LOW (ref >39.00)
LDL CALC: 128 mg/dL — AB (ref 0–99)
NONHDL: 150.65
TRIGLYCERIDES: 113 mg/dL (ref 0.0–149.0)
VLDL: 22.6 mg/dL (ref 0.0–40.0)

## 2014-09-20 LAB — VITAMIN B12: Vitamin B-12: 183 pg/mL — ABNORMAL LOW (ref 211–911)

## 2014-09-20 NOTE — Progress Notes (Signed)
Subjective:    Patient ID: Dawn Jensen, female    DOB: 08-02-1975, 39 y.o.   MRN: [id number]  HPI  Dawn Jensen is a 39 yr old female who presents today for follow up and to discuss cough. She has been followed by another provider for insurance purposes over the last few years, however she wishes to re-establish with [place] at this time.   Cough-Reports that this started 2.5 months ago (1 month after starting lisinopril).  She has been off of lisinopril x >1 month and is now on an ARB. She is on microgestin for birth control.   Cough only occurs after eating or drinking.   HTN-  She is on losartan. BP Readings from Last 3 Encounters:  09/20/14 122/82  10/03/12 120/86  09/12/12 128/80   Depression- reports that she is doing well. She is following with psychiatry. Dawn "m" at [facility].  Hyperlipidemia- on livalo.  Denies myalgia.  Reports that her dose was recently changed.  (2-3 months a go)  Migraines- report stable. Sees Dawn Jensen   She reports right knee cap was dislocated in January/February- saw Dawn Jensen (ortho) had steroid shot which helped.    Has apt with Dawn Jensen for kidney stones. Reports that she had kidney stone in December.    Reflux is well controlled on nexium.    Asthma- reports no recent asthma symptoms.   + OSA- occasional fatigue during the day.  + snoring.    Hypothyroid- on synthroid- started 2-3 months ago.    Review of Systems    see HPI  Past Medical History  Diagnosis Date  . Allergy   . Hyperlipidemia   . IBS (irritable bowel syndrome)   . Migraines     sees Dawn Jensen at [facility]   . Hypertension   . GERD (gastroesophageal reflux disease)   . Asthma     sees Dawn Jensen  . PPD positive, treated     treated with inh  . Depression     sees Dawn Jensen    Social History   Social History  . Marital Status: Married    Spouse Name: N/A  . Number of Children: 0  . Years of  Education: N/A   Occupational History  . CMA    Social History Main Topics  . Smoking status: Never Smoker   . Smokeless tobacco: Never Used  . Alcohol Use: No     Comment: rarely  . Drug Use: No  . Sexual Activity: Not on file   Other Topics Concern  . Not on file   Social History Narrative    Past Surgical History  Procedure Laterality Date  . Adenoidectomy    . Tympanostomy tube placement      bilateral ears, as a child    Family History  Problem Relation Age of Onset  . Hyperlipidemia Mother   . Diabetes Mother   . Hyperthyroidism Mother   . Hypertension Mother   . Cancer Mother     breast  . ADD / ADHD Sister   . Osteoporosis Maternal Grandmother   . Diabetes Maternal Grandmother   . Hypertension Maternal Grandmother   . Heart disease Maternal Grandmother   . Stroke Maternal Grandfather 60  . Cancer Paternal Grandfather     brain cancer    Allergies  Allergen Reactions  . Tizanidine Other (See Comments)    Hallucinations  . Amoxicillin-Pot Clavulanate Other (See Comments)    GI  upset  . Erythromycin     rash  . Lisinopril Cough  . Moxifloxacin     REACTION: nausea  . Statins Other (See Comments)    Muscle pain  . Sulfamethoxazole     REACTION: GI upset  . Topamax [Topiramate]     REACTION: tingling in feet. Metabolic acidosis  . Penicillins Nausea And Vomiting  . Tape Rash    Current Outpatient Prescriptions on File Prior to Visit  Medication Sig Dispense Refill  . esomeprazole (NEXIUM) 40 MG capsule Take 1 capsule (40 mg total) by mouth daily before breakfast. 90 capsule 1  . fluticasone (FLONASE) 50 MCG/ACT nasal spray Place 2 sprays into the nose daily. 16 g 6  . PARoxetine (PAXIL) 40 MG tablet Take 40 mg by mouth every morning.    . zolmitriptan (ZOMIG-ZMT) 5 MG disintegrating tablet Take 5 mg by mouth as needed.     No current facility-administered medications on file prior to visit.    BP 122/82 mmHg  Pulse 87  Temp(Src) 98.2 F  (36.8 C) (Oral)  Resp 16  Wt 243 lb 12.8 oz (110.587 kg)  SpO2 99%     Objective:   Physical Exam  Constitutional: She is oriented to person, place, and time. She appears well-developed and well-nourished.  HENT:  Head: Normocephalic and atraumatic.  Eyes: No scleral icterus.  Neck: Neck supple. No thyromegaly present.  Cardiovascular: Normal rate, regular rhythm and normal heart sounds.   No murmur heard. Pulmonary/Chest: Effort normal and breath sounds normal. No respiratory distress. She has no wheezes.  Lymphadenopathy:    She has no cervical adenopathy.  Neurological: She is alert and oriented to person, place, and time.  Skin: Skin is warm and dry.  Psychiatric: She has a normal mood and affect. Her behavior is normal. Judgment and thought content normal.          Assessment & Plan:

## 2014-09-20 NOTE — Progress Notes (Signed)
Pre visit review using our clinic review tool, if applicable. No additional management support is needed unless otherwise documented below in the visit note. 

## 2014-09-20 NOTE — Assessment & Plan Note (Signed)
Stable.  Monitor.  

## 2014-09-20 NOTE — Assessment & Plan Note (Signed)
Tolerating livalo, check flp

## 2014-09-20 NOTE — Patient Instructions (Signed)
Please complete lab work prior to leaving. Schedule a complete physical at the front desk.  

## 2014-09-20 NOTE — Assessment & Plan Note (Signed)
Obtain home sleep study °

## 2014-09-20 NOTE — Assessment & Plan Note (Signed)
Obtain b12 level.  

## 2014-09-20 NOTE — Assessment & Plan Note (Signed)
Stable, [facility]

## 2014-09-20 NOTE — Assessment & Plan Note (Signed)
Suspect residual from ACE. Advised pt to continue PPI, call if not improved in 1 month, may consider d/c ARB.

## 2014-09-23 ENCOUNTER — Telehealth: Payer: Self-pay | Admitting: Family

## 2014-09-23 NOTE — Telephone Encounter (Signed)
b12 is low.  Advise b12 IM once weekly x 4 weeks, then once monthly. Thyroid test looks good, continue current dose of synthroid.  Calcium is a little low. Repeat calcium in 1 month.

## 2014-09-24 MED ORDER — CYANOCOBALAMIN 1000 MCG/ML IJ SOLN: INTRAMUSCULAR | Status: DC

## 2014-09-24 NOTE — Telephone Encounter (Signed)
Left message for pt to return my call.

## 2014-09-24 NOTE — Telephone Encounter (Signed)
Pt returned my call and voices understanding of below recommendations. Pt requests to get injections through Work.  Rx sent to [facility] retail pharmacy. Pt has CPE on 11/04/14 and will repeat calcium at that time.

## 2014-10-06 ENCOUNTER — Encounter: Payer: Self-pay | Admitting: Family

## 2014-10-07 MED ORDER — HYDROXYZINE HCL 25 MG PO TABS: 25.0000 mg | ORAL_TABLET | ORAL | Status: DC | PRN

## 2014-10-29 ENCOUNTER — Encounter: Payer: Self-pay | Admitting: Family

## 2014-10-29 NOTE — Telephone Encounter (Signed)
Could you please change her CPX to a hospital follow up in our system, same time. Thanks.

## 2014-10-29 NOTE — Telephone Encounter (Signed)
Could you please changer her apt to a

## 2014-10-30 ENCOUNTER — Telehealth: Payer: Self-pay | Admitting: *Deleted

## 2014-10-30 MED ORDER — LEVOTHYROXINE SODIUM 50 MCG PO TABS: 50.0000 ug | ORAL_TABLET | Freq: Every day | ORAL | Status: DC

## 2014-10-30 NOTE — Telephone Encounter (Signed)
[facility] levothyroxine 50mcg once a day.  Refills sent.

## 2014-11-04 ENCOUNTER — Encounter: Payer: Self-pay | Admitting: Family

## 2014-11-04 ENCOUNTER — Ambulatory Visit (INDEPENDENT_AMBULATORY_CARE_PROVIDER_SITE_OTHER): Payer: Commercial Managed Care - PPO | Admitting: Family

## 2014-11-04 ENCOUNTER — Ambulatory Visit (HOSPITAL_BASED_OUTPATIENT_CLINIC_OR_DEPARTMENT_OTHER)
Admission: RE | Admit: 2014-11-04 | Discharge: 2014-11-04 | Disposition: A | Discharge status: Home / Self Care | Payer: Commercial Managed Care - PPO | Source: Ambulatory Visit | Attending: Family | Admitting: Family

## 2014-11-04 VITALS — BP 122/94 | HR 110 | Temp 98.9°F | Ht 62.0 in | Wt 243.0 lb

## 2014-11-04 DIAGNOSIS — R059 Cough, unspecified: Secondary | ICD-10-CM

## 2014-11-04 DIAGNOSIS — S2239XA Fracture of one rib, unspecified side, initial encounter for closed fracture: Secondary | ICD-10-CM | POA: Insufficient documentation

## 2014-11-04 DIAGNOSIS — R05 Cough: Secondary | ICD-10-CM

## 2014-11-04 DIAGNOSIS — R0781 Pleurodynia: Secondary | ICD-10-CM | POA: Insufficient documentation

## 2014-11-04 DIAGNOSIS — R635 Abnormal weight gain: Secondary | ICD-10-CM

## 2014-11-04 DIAGNOSIS — M5432 Sciatica, left side: Secondary | ICD-10-CM

## 2014-11-04 DIAGNOSIS — G4733 Obstructive sleep apnea (adult) (pediatric): Secondary | ICD-10-CM | POA: Insufficient documentation

## 2014-11-04 DIAGNOSIS — S2232XA Fracture of one rib, left side, initial encounter for closed fracture: Secondary | ICD-10-CM | POA: Diagnosis not present

## 2014-11-04 MED ORDER — METOPROLOL SUCCINATE ER 50 MG PO TB24: 50.0000 mg | ORAL_TABLET | Freq: Every day | ORAL | Status: DC

## 2014-11-04 MED ORDER — NORETHIN ACE-ETH ESTRAD-FE 1.5-30 MG-MCG PO TABS: 1.0000 | ORAL_TABLET | Freq: Every day | ORAL | Status: DC

## 2014-11-04 NOTE — Assessment & Plan Note (Signed)
Recommended tylenol prn pain.  No effusions noted on chest x ray.

## 2014-11-04 NOTE — Assessment & Plan Note (Signed)
Discussed diet, exercise, weight loss.  

## 2014-11-04 NOTE — Progress Notes (Signed)
Pre visit review using our clinic review tool, if applicable. No additional management support is needed unless otherwise documented below in the visit note. 

## 2014-11-04 NOTE — Progress Notes (Signed)
Subjective:    Patient ID: Dawn Jensen, female    DOB: 04/14/1975, 39 y.o.   MRN: [id number]  HPI   Dawn Jensen is a 39 yr old female who presents today for follow up.    Rib Fracture- was told that she had hypocalcemia in the hospital.Reports + cough x 3-4 months. [facility] record is reviewed from her visit to [facility]  10/21/14.   Sciatica- reports that she has been having sciatic pain on the left side. Had a CT scan which noted  Nondisplaced fracture of the left posterior eighth rib. Trace left pleural effusion. Nonobstructing left renal calculi.  She denies trauma to the area.  But she did have some sharp pain which began after she bent down to pick up a pizza box.    Weight gain- She has gained 50 pounds in the last 2 years.  Reports that she is more sedentary than she was 1 year ago. Notes that she has some left sided sciatic pain which limits her ability to exercise.  TSH was normal 1 month ago.   Wt Readings from Last 3 Encounters:  11/04/14 243 lb (110.224 kg)  09/20/14 243 lb 12.8 oz (110.587 kg)  10/03/12 192 lb 0.6 oz (87.109 kg)   Cough- denies gerd or post nasal drip.   OSA- had sleep study- which noted mild OSA- this was ordered by Hali Marry NP at [facility] neurolog[facility] and she is scheduled to review this result with her at her upcoming apptointment. She reports ongoing fatigue.   Review of Systems     Past Medical History  Diagnosis Date  . Allergy   . Hyperlipidemia   . IBS (irritable bowel syndrome)   . Migraines     sees Hali Marry NP at [facility]   . Hypertension   . GERD (gastroesophageal reflux disease)   . Asthma     sees Dr. Gary Fleet  . PPD positive, treated     treated with inh  . Depression     sees Valinda Hoar NP    Social History   Social History  . Marital Status: Married    Spouse Name: N/A  . Number of Children: 0  . Years of Education: N/A   Occupational History  . CMA    Social History Main  Topics  . Smoking status: Never Smoker   . Smokeless tobacco: Never Used  . Alcohol Use: No     Comment: rarely  . Drug Use: No  . Sexual Activity: Not on file   Other Topics Concern  . Not on file   Social History Narrative    Past Surgical History  Procedure Laterality Date  . Adenoidectomy    . Tympanostomy tube placement      bilateral ears, as a child    Family History  Problem Relation Age of Onset  . Hyperlipidemia Mother   . Diabetes Mother   . Hyperthyroidism Mother   . Hypertension Mother   . Cancer Mother     breast  . ADD / ADHD Sister   . Osteoporosis Maternal Grandmother   . Diabetes Maternal Grandmother   . Hypertension Maternal Grandmother   . Heart disease Maternal Grandmother   . Stroke Maternal Grandfather 60  . Cancer Paternal Grandfather     brain cancer    Allergies  Allergen Reactions  . Tizanidine Other (See Comments)    Hallucinations  . Amoxicillin-Pot Clavulanate Other (See Comments)    GI upset  .  Erythromycin     rash  . Lisinopril Cough  . Moxifloxacin     REACTION: nausea  . Statins Other (See Comments)    Muscle pain  . Sulfamethoxazole     REACTION: GI upset  . Topamax [Topiramate]     REACTION: tingling in feet. Metabolic acidosis  . Penicillins Nausea And Vomiting  . Tape Rash    Current Outpatient Prescriptions on File Prior to Visit  Medication Sig Dispense Refill  . ARIPiprazole (ABILIFY) 10 MG tablet Take 10 mg by mouth daily.    . cyanocobalamin (,VITAMIN B-12,) 1000 MCG/ML injection Inject 1000mcg into the muscle once a week for 4 weeks then once a month thereafter. 4 mL 1  . diclofenac (CATAFLAM) 50 MG tablet Take 50 mg by mouth daily as needed.    Marland Kitchen. esomeprazole (NEXIUM) 40 MG capsule Take 1 capsule (40 mg total) by mouth daily before breakfast. 90 capsule 1  . fluticasone (FLONASE) 50 MCG/ACT nasal spray Place 2 sprays into the nose daily. 16 g 6  . hydrOXYzine (ATARAX/VISTARIL) 25 MG tablet Take 1 tablet  (25 mg total) by mouth as needed. 30 tablet 0  . levothyroxine (SYNTHROID, LEVOTHROID) 50 MCG tablet Take 1 tablet (50 mcg total) by mouth daily before breakfast. 90 tablet 1  . losartan (COZAAR) 100 MG tablet Take 100 mg by mouth daily.    . norethindrone-ethinyl estradiol-iron (MICROGESTIN FE,GILDESS FE,LOESTRIN FE) 1.5-30 MG-MCG tablet Take 1 tablet by mouth daily.    . ondansetron (ZOFRAN-ODT) 4 MG disintegrating tablet Take 4 mg by mouth every 8 (eight) hours as needed.    Marland Kitchen. PARoxetine (PAXIL) 40 MG tablet Take 40 mg by mouth every morning.    . Pitavastatin Calcium (LIVALO) 2 MG TABS Take 1 tablet by mouth daily.    Marland Kitchen. zolmitriptan (ZOMIG-ZMT) 5 MG disintegrating tablet Take 5 mg by mouth as needed.     No current facility-administered medications on file prior to visit.    BP 122/94 mmHg  Pulse 110  Temp(Src) 98.9 F (37.2 C) (Oral)  Ht 5\' 2"  (1.575 m)  Wt 243 lb (110.224 kg)  BMI 44.43 kg/m2  SpO2 97%    Objective:   Physical Exam  Constitutional: She is oriented to person, place, and time. She appears well-developed and well-nourished.  HENT:  Head: Normocephalic and atraumatic.  Neck: No thyromegaly present.  Cardiovascular: Normal rate, regular rhythm and normal heart sounds.   No murmur heard. Pulmonary/Chest: Effort normal and breath sounds normal. No respiratory distress. She has no wheezes.  Musculoskeletal: She exhibits no edema.  Lymphadenopathy:    She has no cervical adenopathy.  Neurological: She is alert and oriented to person, place, and time.  Reflex Scores:      Patellar reflexes are 1+ on the right side and 1+ on the left side. Psychiatric: She has a normal mood and affect. Her behavior is normal. Judgment and thought content normal.          Assessment & Plan:  Sciatica- will refer to physical therapy.consider MRI if symptoms worsen or do not improve with PT.   Cough- suspect secondary to ARB. D/C arb and start metoprolol.  CXR clear.    Hypocalcemia- obtain ionized calcium level.

## 2014-11-04 NOTE — Assessment & Plan Note (Signed)
Mild- following up with neurology to discuss results.

## 2014-11-04 NOTE — Patient Instructions (Signed)
Please complete lab work prior to leaving. Complete chest x ray on the first floor. Stop losartan, start metoprolol. Try to add some low impact exercise such as recumbant bike, water walking and watch your portions/food choices to help with weight loss. Follow up in 2 weeks.

## 2014-11-05 ENCOUNTER — Encounter: Payer: Self-pay | Admitting: Family

## 2014-11-05 LAB — CALCIUM, IONIZED: Calcium, Ion: 1.21 mmol/L (ref 1.12–1.32)

## 2014-11-13 ENCOUNTER — Encounter: Payer: Self-pay | Admitting: Family

## 2014-11-14 MED ORDER — HYDROXYZINE HCL 25 MG PO TABS: 25.0000 mg | ORAL_TABLET | ORAL | Status: AC | PRN

## 2014-11-14 MED ORDER — PAROXETINE HCL 40 MG PO TABS: 40.0000 mg | ORAL_TABLET | ORAL | Status: AC

## 2014-11-19 ENCOUNTER — Ambulatory Visit (INDEPENDENT_AMBULATORY_CARE_PROVIDER_SITE_OTHER): Payer: Commercial Managed Care - PPO | Admitting: Family

## 2014-11-19 ENCOUNTER — Encounter: Payer: Self-pay | Admitting: Family

## 2014-11-19 ENCOUNTER — Telehealth: Payer: Self-pay | Admitting: Family

## 2014-11-19 VITALS — BP 134/87 | HR 89 | Temp 98.7°F | Resp 16 | Ht 62.0 in | Wt 245.0 lb

## 2014-11-19 DIAGNOSIS — I10 Essential (primary) hypertension: Secondary | ICD-10-CM | POA: Diagnosis not present

## 2014-11-19 DIAGNOSIS — S2249XA Multiple fractures of ribs, unspecified side, initial encounter for closed fracture: Secondary | ICD-10-CM | POA: Diagnosis not present

## 2014-11-19 DIAGNOSIS — J0100 Acute maxillary sinusitis, unspecified: Secondary | ICD-10-CM | POA: Diagnosis not present

## 2014-11-19 DIAGNOSIS — E559 Vitamin D deficiency, unspecified: Secondary | ICD-10-CM | POA: Insufficient documentation

## 2014-11-19 LAB — VITAMIN D 25 HYDROXY (VIT D DEFICIENCY, FRACTURES): VITD: 11.88 ng/mL — AB (ref 30.00–100.00)

## 2014-11-19 MED ORDER — CEFDINIR 300 MG PO CAPS: 300.0000 mg | ORAL_CAPSULE | Freq: Two times a day (BID) | ORAL | Status: DC

## 2014-11-19 MED ORDER — LORATADINE 10 MG PO TABS: 10.0000 mg | ORAL_TABLET | Freq: Every day | ORAL | Status: AC

## 2014-11-19 MED ORDER — VITAMIN D (ERGOCALCIFEROL) 1.25 MG (50000 UNIT) PO CAPS: 50000.0000 [IU] | ORAL_CAPSULE | ORAL | Status: AC

## 2014-11-19 NOTE — Patient Instructions (Signed)
Please complete lab work prior to leaving. Start augmentin (antibiotic) and claritin 10mg  once daily. Call if cough worsens or does not improve in 3-4 days.

## 2014-11-19 NOTE — Telephone Encounter (Signed)
Left message with pt's spouse to have pt return my call.

## 2014-11-19 NOTE — Telephone Encounter (Signed)
Notified pt and she voices understanding. Pt requests CPE on 02/10/15 and will repeat vitamin D at that time. Rx sent to pharmacy.

## 2014-11-19 NOTE — Assessment & Plan Note (Signed)
Stable on beta blocker, continue same.

## 2014-11-19 NOTE — Progress Notes (Signed)
Subjective:    Patient ID: Dawn Jensen, female    DOB: 10/28/1975, 39 y.o.   MRN: [id number]  HPI  Dawn Jensen is a 39 yr old female who presents today for follow up of cough.  She was seen on 10/17 with complaint of cough.  CXR was clear.  It was felt that ARB could be contributing.  ARB was d/c'd and she was placed on metoprolol.   Today she reports that cough has not improved.  She reports significant sinus drainage.  Using flonase which is helping some.  Reports sinus drainage started 1.5 weeks ago.  Clear (sometimes green drainage) in color.  No fever.    Review of Systems See HPI  Past Medical History  Diagnosis Date  . Allergy   . Hyperlipidemia   . IBS (irritable bowel syndrome)   . Migraines     sees Hali [facility]ryrystal Rose NP at [facility]   . Hypertension   . GERD (gastroesophageal reflux disease)   . Asthma     sees Dr. Gary FleetWhalen  . PPD positive, treated     treated with inh  . Depression     sees Valinda HoarMeredith Baker NP    Social History   Social History  . Marital Status: Married    Spouse Name: N/A  . Number of Children: 0  . Years of Education: N/A   Occupational History  . CMA    Social History Main Topics  . Smoking status: Never Smoker   . Smokeless tobacco: Never Used  . Alcohol Use: No     Comment: rarely  . Drug Use: No  . Sexual Activity: Not on file   Other Topics Concern  . Not on file   Social History Narrative    Past Surgical History  Procedure Laterality Date  . Adenoidectomy    . Tympanostomy tube placement      bilateral ears, as a child    Family History  Problem Relation Age of Onset  . Hyperlipidemia Mother   . Diabetes Mother   . Hyperthyroidism Mother   . Hypertension Mother   . Cancer Mother     breast  . ADD / ADHD Sister   . Osteoporosis Maternal Grandmother   . Diabetes Maternal Grandmother   . Hypertension Maternal Grandmother   . Heart disease Maternal Grandmother   . Stroke Maternal Grandfather 60  .  Cancer Paternal Grandfather     brain cancer    Allergies  Allergen Reactions  . Tizanidine Other (See Comments)    Hallucinations  . Amoxicillin-Pot Clavulanate Other (See Comments)    GI upset  . Erythromycin     rash  . Lisinopril Cough  . Moxifloxacin     REACTION: nausea  . Statins Other (See Comments)    Muscle pain  . Sulfamethoxazole     REACTION: GI upset  . Topamax [Topiramate]     REACTION: tingling in feet. Metabolic acidosis  . Penicillins Nausea And Vomiting  . Tape Rash    Current Outpatient Prescriptions on File Prior to Visit  Medication Sig Dispense Refill  . ARIPiprazole (ABILIFY) 10 MG tablet Take 10 mg by mouth daily.    . cyanocobalamin (,VITAMIN B-12,) 1000 MCG/ML injection Inject 1000mcg into the muscle once a week for 4 weeks then once a month thereafter. 4 mL 1  . diclofenac (CATAFLAM) 50 MG tablet Take 50 mg by mouth daily as needed.    Marland Kitchen. esomeprazole (NEXIUM) 40 MG capsule Take 1 capsule (  40 mg total) by mouth daily before breakfast. 90 capsule 1  . fluticasone (FLONASE) 50 MCG/ACT nasal spray Place 2 sprays into the nose daily. 16 g 6  . hydrOXYzine (ATARAX/VISTARIL) 25 MG tablet Take 1 tablet (25 mg total) by mouth as needed. 30 tablet 2  . levothyroxine (SYNTHROID, LEVOTHROID) 50 MCG tablet Take 1 tablet (50 mcg total) by mouth daily before breakfast. 90 tablet 1  . losartan (COZAAR) 100 MG tablet Take 100 mg by mouth daily.    . metoprolol succinate (TOPROL-XL) 50 MG 24 hr tablet Take 1 tablet (50 mg total) by mouth daily. Take with or immediately following a meal. 30 tablet 3  . norethindrone-ethinyl estradiol-iron (MICROGESTIN FE,GILDESS FE,LOESTRIN FE) 1.5-30 MG-MCG tablet Take 1 tablet by mouth daily. 1 Package 5  . ondansetron (ZOFRAN-ODT) 4 MG disintegrating tablet Take 4 mg by mouth every 8 (eight) hours as needed.    Marland Kitchen PARoxetine (PAXIL) 40 MG tablet Take 1 tablet (40 mg total) by mouth every morning. 30 tablet 2  . Pitavastatin Calcium  (LIVALO) 2 MG TABS Take 1 tablet by mouth daily.    Marland Kitchen zolmitriptan (ZOMIG-ZMT) 5 MG disintegrating tablet Take 5 mg by mouth as needed.     No current facility-administered medications on file prior to visit.    BP 134/87 mmHg  Pulse 89  Temp(Src) 98.7 F (37.1 C) (Oral)  Resp 16  Ht  (1.575 m)  Wt 245 lb (111.131 kg)  BMI 44.80 kg/m2  SpO2 96%       Objective:   Physical Exam  Constitutional: She is oriented to person, place, and time. She appears well-developed and well-nourished. No distress.  HENT:  Head: Normocephalic and atraumatic.  Nose: Right sinus exhibits maxillary sinus tenderness. Right sinus exhibits no frontal sinus tenderness. Left sinus exhibits maxillary sinus tenderness. Left sinus exhibits no frontal sinus tenderness.  Cardiovascular: Normal rate and regular rhythm.   No murmur heard. Pulmonary/Chest: Effort normal and breath sounds normal. No respiratory distress. She has no wheezes. She has no rales. She exhibits no tenderness.  Musculoskeletal: She exhibits no edema.  Lymphadenopathy:    She has no cervical adenopathy.  Neurological: She is alert and oriented to person, place, and time.  Psychiatric: She has a normal mood and affect. Her behavior is normal. Judgment and thought content normal.          Assessment & Plan:  Sinusitis- continue flonase, add claritin add cefdinir.  Rib fracture- calcium was normal. Will also obtain PTH and vit D level today.

## 2014-11-19 NOTE — Progress Notes (Signed)
Pre visit review using our clinic review tool, if applicable. No additional management support is needed unless otherwise documented below in the visit note. 

## 2014-11-19 NOTE — Telephone Encounter (Signed)
Vitamin D level is low.  Advise patient to begin vit D 50000 units PO once weekly for 12 weeks, then repeat vit D level (dx Vit D deficiency).

## 2014-11-20 ENCOUNTER — Encounter: Payer: Self-pay | Admitting: Family

## 2014-11-20 DIAGNOSIS — E213 Hyperparathyroidism, unspecified: Secondary | ICD-10-CM

## 2014-11-20 LAB — PARATHYROID HORMONE, INTACT (NO CA): PTH: 104 pg/mL — ABNORMAL HIGH (ref 14–64)

## 2014-11-22 NOTE — Telephone Encounter (Signed)
-----   Message from Lilian KapurAnita S Walker sent at 11/22/2014  9:40 AM EDT ----- Regarding: Home Sleep Test Dawn Jensen, you put in an order for the patient to have the home sleep test. I had her schedule on 10/11/2014 and she didn't come pick up the machine. I have tried calling her 3 more time to reschedule and she is not calling me back. I'm not sure she really wants to do the home sleep test. I have left messages with her husband 2 different times and there is still no response.   Thanks, Synetta FailAnita, [facility]

## 2014-11-28 ENCOUNTER — Telehealth: Payer: Self-pay | Admitting: Family

## 2014-11-28 NOTE — Telephone Encounter (Signed)
Sent staff message to Sandford CrazeMelissa O'sullivan on 11/22/2014 explaining that I had the patient scheduled for HST and she no showed and that I had tried 4 different times to contact the patient to reschedule the Home Sleep Test.

## 2014-12-27 ENCOUNTER — Ambulatory Visit (HOSPITAL_BASED_OUTPATIENT_CLINIC_OR_DEPARTMENT_OTHER)
Admission: RE | Admit: 2014-12-27 | Discharge: 2014-12-27 | Disposition: A | Discharge status: Home / Self Care | Payer: Commercial Managed Care - PPO | Source: Ambulatory Visit | Attending: Family | Admitting: Family

## 2014-12-27 ENCOUNTER — Encounter: Payer: Self-pay | Admitting: Family

## 2014-12-27 DIAGNOSIS — E0590 Thyrotoxicosis, unspecified without thyrotoxic crisis or storm: Secondary | ICD-10-CM | POA: Diagnosis not present

## 2014-12-27 DIAGNOSIS — Z79899 Other long term (current) drug therapy: Secondary | ICD-10-CM | POA: Insufficient documentation

## 2014-12-27 DIAGNOSIS — E213 Hyperparathyroidism, unspecified: Secondary | ICD-10-CM | POA: Diagnosis not present

## 2014-12-31 ENCOUNTER — Encounter: Payer: Self-pay | Admitting: Family

## 2015-01-15 ENCOUNTER — Other Ambulatory Visit: Payer: Self-pay | Admitting: Family

## 2015-01-17 ENCOUNTER — Telehealth: Payer: Self-pay | Admitting: Family

## 2015-01-17 NOTE — Telephone Encounter (Signed)
Pt will need to have [facility] behavior health [facility] that she saw in 10/2014 to refill Abilify, never filled by Melissa.

## 2015-01-17 NOTE — Telephone Encounter (Signed)
LVM advising spouse for patient to c/b

## 2015-01-17 NOTE — Telephone Encounter (Signed)
Please advise. Do not see where Dawn Jensen has ever refilled Abilify.

## 2015-01-17 NOTE — Telephone Encounter (Signed)
Relation to GE:XBMWpt:self Call back [phone] Pharmacy: [facility] - [place] - [address] [phone] (Phone) [phone] (Fax)         Reason for call:  Patient requesting a refill ARIPiprazole (ABILIFY) 10 MG tablet and wanted to also inform you she has a psychologist appointment 02/16/14

## 2015-01-17 NOTE — Telephone Encounter (Signed)
We have  never prescribed that medication, needs to call the prescriber

## 2015-01-21 NOTE — Telephone Encounter (Signed)
Sent mychart message to pt to contact psychiatrist for refills.

## 2015-01-22 ENCOUNTER — Encounter: Payer: Self-pay | Admitting: Family

## 2015-01-22 NOTE — Telephone Encounter (Signed)
Melissa,  Please advise? 

## 2015-01-23 MED ORDER — ARIPIPRAZOLE 10 MG PO TABS: 10.0000 mg | ORAL_TABLET | Freq: Every day | ORAL | Status: AC

## 2015-02-10 ENCOUNTER — Encounter: Payer: Commercial Managed Care - PPO | Admitting: Family

## 2015-02-14 ENCOUNTER — Telehealth: Payer: Self-pay | Admitting: General Practice

## 2015-02-14 NOTE — Telephone Encounter (Signed)
Called pt to complete pre-visit phone call. Left message with spouse to return call.

## 2015-02-17 ENCOUNTER — Ambulatory Visit (INDEPENDENT_AMBULATORY_CARE_PROVIDER_SITE_OTHER): Payer: Commercial Managed Care - PPO | Admitting: Family

## 2015-02-17 ENCOUNTER — Encounter: Payer: Self-pay | Admitting: Family

## 2015-02-17 ENCOUNTER — Telehealth: Payer: Self-pay | Admitting: Family

## 2015-02-17 VITALS — BP 130/90 | HR 90 | Temp 97.8°F | Resp 18 | Ht 62.0 in | Wt 253.6 lb

## 2015-02-17 DIAGNOSIS — E559 Vitamin D deficiency, unspecified: Secondary | ICD-10-CM | POA: Diagnosis not present

## 2015-02-17 DIAGNOSIS — R21 Rash and other nonspecific skin eruption: Secondary | ICD-10-CM | POA: Diagnosis not present

## 2015-02-17 DIAGNOSIS — Z0001 Encounter for general adult medical examination with abnormal findings: Secondary | ICD-10-CM | POA: Diagnosis not present

## 2015-02-17 DIAGNOSIS — R030 Elevated blood-pressure reading, without diagnosis of hypertension: Secondary | ICD-10-CM

## 2015-02-17 DIAGNOSIS — Z0000 Encounter for general adult medical examination without abnormal findings: Secondary | ICD-10-CM

## 2015-02-17 DIAGNOSIS — R7989 Other specified abnormal findings of blood chemistry: Secondary | ICD-10-CM

## 2015-02-17 DIAGNOSIS — E538 Deficiency of other specified B group vitamins: Secondary | ICD-10-CM | POA: Diagnosis not present

## 2015-02-17 LAB — URINALYSIS, ROUTINE W REFLEX MICROSCOPIC
Bilirubin Urine: NEGATIVE
Ketones, ur: NEGATIVE
Nitrite: NEGATIVE
PH: 5.5 (ref 5.0–8.0)
SPECIFIC GRAVITY, URINE: 1.02 (ref 1.000–1.030)
Total Protein, Urine: NEGATIVE
Urine Glucose: NEGATIVE
Urobilinogen, UA: 0.2 (ref 0.0–1.0)

## 2015-02-17 LAB — TSH: TSH: 6.05 u[IU]/mL — ABNORMAL HIGH (ref 0.35–4.50)

## 2015-02-17 LAB — HEPATIC FUNCTION PANEL
ALBUMIN: 3.9 g/dL (ref 3.5–5.2)
ALK PHOS: 86 U/L (ref 39–117)
ALT: 19 U/L (ref 0–35)
AST: 16 U/L (ref 0–37)
Bilirubin, Direct: 0 mg/dL (ref 0.0–0.3)
TOTAL PROTEIN: 6.9 g/dL (ref 6.0–8.3)
Total Bilirubin: 0.2 mg/dL (ref 0.2–1.2)

## 2015-02-17 LAB — CBC WITH DIFFERENTIAL/PLATELET
BASOS PCT: 0.4 % (ref 0.0–3.0)
Basophils Absolute: 0 10*3/uL (ref 0.0–0.1)
EOS ABS: 0.3 10*3/uL (ref 0.0–0.7)
Eosinophils Relative: 4 % (ref 0.0–5.0)
HEMATOCRIT: 42.1 % (ref 36.0–46.0)
Hemoglobin: 13.9 g/dL (ref 12.0–15.0)
LYMPHS PCT: 31.8 % (ref 12.0–46.0)
Lymphs Abs: 2.4 10*3/uL (ref 0.7–4.0)
MCHC: 33 g/dL (ref 30.0–36.0)
MCV: 83.1 fl (ref 78.0–100.0)
MONO ABS: 0.4 10*3/uL (ref 0.1–1.0)
Monocytes Relative: 5.6 % (ref 3.0–12.0)
Neutro Abs: 4.4 10*3/uL (ref 1.4–7.7)
Neutrophils Relative %: 58.2 % (ref 43.0–77.0)
Platelets: 383 10*3/uL (ref 150.0–400.0)
RBC: 5.07 Mil/uL (ref 3.87–5.11)
RDW: 15 % (ref 11.5–15.5)
WBC: 7.6 10*3/uL (ref 4.0–10.5)

## 2015-02-17 LAB — BASIC METABOLIC PANEL
BUN: 19 mg/dL (ref 6–23)
CALCIUM: 8.5 mg/dL (ref 8.4–10.5)
CHLORIDE: 107 meq/L (ref 96–112)
CO2: 22 mEq/L (ref 19–32)
CREATININE: 0.75 mg/dL (ref 0.40–1.20)
GFR: 90.5 mL/min (ref >60.00)
Glucose, Bld: 91 mg/dL (ref 70–99)
Potassium: 4.1 mEq/L (ref 3.5–5.1)
Sodium: 139 mEq/L (ref 135–145)

## 2015-02-17 LAB — VITAMIN D 25 HYDROXY (VIT D DEFICIENCY, FRACTURES): VITD: 20.26 ng/mL — ABNORMAL LOW (ref 30.00–100.00)

## 2015-02-17 LAB — LIPID PANEL
CHOL/HDL RATIO: 8
CHOLESTEROL: 281 mg/dL — AB (ref 0–200)
HDL: 35.3 mg/dL — ABNORMAL LOW (ref >39.00)
NonHDL: 245.69
TRIGLYCERIDES: 237 mg/dL — AB (ref 0.0–149.0)
VLDL: 47.4 mg/dL — AB (ref 0.0–40.0)

## 2015-02-17 LAB — VITAMIN B12: Vitamin B-12: >1500 pg/mL — ABNORMAL HIGH (ref 211–911)

## 2015-02-17 LAB — LDL CHOLESTEROL, DIRECT: LDL DIRECT: 218 mg/dL

## 2015-02-17 MED ORDER — BETAMETHASONE DIPROPIONATE 0.05 % EX CREA: TOPICAL_CREAM | Freq: Two times a day (BID) | CUTANEOUS | Status: AC

## 2015-02-17 MED ORDER — CYANOCOBALAMIN 1000 MCG/ML IJ SOLN
1000.0000 ug | Freq: Once | INTRAMUSCULAR | Status: AC
  Administered 2015-02-17: 1000 ug via INTRAMUSCULAR

## 2015-02-17 NOTE — Progress Notes (Signed)
Pre visit review using our clinic review tool, if applicable. No additional management support is needed unless otherwise documented below in the visit note. 

## 2015-02-17 NOTE — Patient Instructions (Signed)
Please complete lab work prior to leaving.   Please work on [employer], exercise, weight loss. Maintain a low sodium diet.

## 2015-02-17 NOTE — Telephone Encounter (Signed)
Pt brought letter to her visit today from [employer] that her nexium is no longer covered under her formulary.  Called [phone] and left detailed message to call me back or fax letter to initiate PA.

## 2015-02-17 NOTE — Progress Notes (Signed)
Subjective:    Patient ID: Dawn Jensen, female    DOB: 19-Apr-1975, 40 y.o.   MRN: [id number]  HPI  Patient presents today for complete physical.  Immunizations: 10/16 flu shot Diet: diet is healthy. Trying to change her lifestyle and diet.  Wt Readings from Last 3 Encounters:  02/17/15 253 lb 9.6 oz (115.032 kg)  11/19/14 245 lb (111.131 kg)  11/04/14 243 lb (110.224 kg)  Exercise: 2 x a week, will meet with a trainer Pap Smear:1 year ago- will see GYN Mammogram: 10/16 Vision:  12/16 Dental:  10/16  Rash- reports rash on both legs x 2 months.     Review of Systems  Constitutional: Positive for unexpected weight change.  HENT: Negative for rhinorrhea.   Eyes: Negative for visual disturbance.  Respiratory: Negative for cough.   Cardiovascular: Negative for leg swelling.  Gastrointestinal: Negative for diarrhea and constipation.  Genitourinary: Negative for dysuria, frequency and menstrual problem.  Musculoskeletal:       + knee pain, sees Dr. Edmon Crape, ortho for that today  Skin: Positive for rash.  Neurological: Negative for headaches.  Hematological: Negative for adenopathy.  Psychiatric/Behavioral:       Reports depression is well controlled   Past Medical History  Diagnosis Date  . Allergy   . Hyperlipidemia   . IBS (irritable bowel syndrome)   . Migraines     sees Hali Marry NP at [facility]   . Hypertension   . GERD (gastroesophageal reflux disease)   . Asthma     sees Dr. Gary Fleet  . PPD positive, treated     treated with inh  . Depression     sees Valinda Hoar NP    Social History   Social History  . Marital Status: Married    Spouse Name: N/A  . Number of Children: 0  . Years of Education: N/A   Occupational History  . CMA    Social History Main Topics  . Smoking status: Never Smoker   . Smokeless tobacco: Never Used  . Alcohol Use: 0.0 oz/week    0 Standard drinks or equivalent per week     Comment: rarely  . Drug  Use: No  . Sexual Activity: Not on file   Other Topics Concern  . Not on file   Social History Narrative    Past Surgical History  Procedure Laterality Date  . Adenoidectomy    . Tympanostomy tube placement      bilateral ears, as a child    Family History  Problem Relation Age of Onset  . Hyperlipidemia Mother   . Diabetes Mother   . Hyperthyroidism Mother   . Hypertension Mother   . Cancer Mother     breast  . ADD / ADHD Sister   . Osteoporosis Maternal Grandmother   . Diabetes Maternal Grandmother   . Hypertension Maternal Grandmother   . Heart disease Maternal Grandmother   . Stroke Maternal Grandfather 60  . Cancer Paternal Grandfather     brain cancer    Allergies  Allergen Reactions  . Tizanidine Other (See Comments)    Hallucinations  . Amoxicillin-Pot Clavulanate Other (See Comments)    GI upset  . Erythromycin     rash  . Lisinopril Cough  . Moxifloxacin     REACTION: nausea  . Statins Other (See Comments)    Muscle pain  . Sulfamethoxazole     REACTION: GI upset  . Topamax [Topiramate]     REACTION:  tingling in feet. Metabolic acidosis  . Penicillins Nausea And Vomiting  . Tape Rash    Current Outpatient Prescriptions on File Prior to Visit  Medication Sig Dispense Refill  . ARIPiprazole (ABILIFY) 10 MG tablet Take 1 tablet (10 mg total) by mouth daily. 30 tablet 0  . cyanocobalamin (,VITAMIN B-12,) 1000 MCG/ML injection Inject into the muscle once a week for 4 weeks then once a month thereafter. 4 mL 1  . diclofenac (CATAFLAM) 50 MG tablet Take 50 mg by mouth daily as needed.    Marland Kitchen esomeprazole (NEXIUM) 40 MG capsule Take 1 capsule (40 mg total) by mouth daily before breakfast. 90 capsule 1  . fluticasone (FLONASE) 50 MCG/ACT nasal spray Place 2 sprays into the nose daily. 16 g 6  . hydrOXYzine (ATARAX/VISTARIL) 25 MG tablet Take 1 tablet (25 mg total) by mouth as needed. 30 tablet 2  . levothyroxine (SYNTHROID, LEVOTHROID) 50 MCG  tablet Take 1 tablet (50 mcg total) by mouth daily before breakfast. 90 tablet 1  . loratadine (CLARITIN) 10 MG tablet Take 1 tablet (10 mg total) by mouth daily. 30 tablet 11  . losartan (COZAAR) 100 MG tablet Take 100 mg by mouth daily.    . metoprolol succinate (TOPROL-XL) 50 MG 24 hr tablet Take 1 tablet (50 mg total) by mouth daily. Take with or immediately following a meal. 30 tablet 1  . norethindrone-ethinyl estradiol-iron (MICROGESTIN FE,GILDESS FE,LOESTRIN FE) 1.5-30 MG-MCG tablet Take 1 tablet by mouth daily. 1 Package 5  . ondansetron (ZOFRAN-ODT) 4 MG disintegrating tablet Take 4 mg by mouth every 8 (eight) hours as needed.    Marland Kitchen PARoxetine (PAXIL) 40 MG tablet Take 1 tablet (40 mg total) by mouth every morning. 30 tablet 2  . Pitavastatin Calcium (LIVALO) 2 MG TABS Take 1 tablet by mouth daily.    . Vitamin D, Ergocalciferol, (DRISDOL) 50000 UNITS CAPS capsule Take 1 capsule (50,000 Units total) by mouth every 7 (seven) days. 12 capsule 0  . zolmitriptan (ZOMIG-ZMT) 5 MG disintegrating tablet Take 5 mg by mouth as needed.     No current facility-administered medications on file prior to visit.    BP 130/90 mmHg  Pulse 90  Temp(Src) 97.8 F (36.6 C) (Oral)  Resp 18  Ht  (1.575 m)  Wt 253 lb 9.6 oz (115.032 kg)  BMI 46.37 kg/m2       Objective:   Physical Exam Physical Exam  Constitutional: She is oriented to person, place, and time. She appears well-developed and well-nourished. No distress.  HENT:  Head: Normocephalic and atraumatic.  Right Ear: Tympanic membrane and ear canal normal.  Left Ear: Tympanic membrane and ear canal normal.  Mouth/Throat: Oropharynx is clear and moist.  Eyes: Pupils are equal, round, and reactive to light. No scleral icterus.  Neck: Normal range of motion. No thyromegaly present.  Cardiovascular: Normal rate and regular rhythm.   No murmur heard. Pulmonary/Chest: Effort normal and breath sounds normal. No respiratory distress. He  has no wheezes. She has no rales. She exhibits no tenderness.  Abdominal: Soft. Bowel sounds are normal. He exhibits no distension and no mass. There is no tenderness. There is no rebound and no guarding.  Musculoskeletal: She exhibits no edema.  Lymphadenopathy:    She has no cervical adenopathy.  Neurological: She is alert and oriented to person, place, and time. She has normal patellar reflexes. She exhibits normal muscle tone. Coordination normal.  Skin: Skin is warm and dry. excoriated rash noted  on right shin,mild rash on left shin. Psychiatric: She has a normal mood and affect. Her behavior is normal. Judgment and thought content normal.  Breasts: Examined lying Right: Without masses, retractions, discharge or axillary adenopathy.  Left: Without masses, retractions, discharge or axillary adenopathy.  Pelvic: deferred to GYN          Assessment & Plan:          Assessment & Plan:  Preventative Health Care- discussed diet, exercise, weight loss. Obtain routine labs.  Pap/Mammo up to date.  Skin rash- trial of diprolene bid, continue eucerin, advised pt to call if no improvement and we will arrange dermatology apt.   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   Elevated BP reading- dbp has been borderline last few visits.  Advised pt to focus on low sodium diet, exercise, weight loss.   BP Readings from Last 3 Encounters:  02/17/15 130/90  11/19/14 134/87  11/04/14 122/94   EKG tracing is personally reviewed.  EKG notes NSR.  No acute changes.   Vit D def- reports she completed the 12 week course of 16109 units of vit D.  B12 def- due for b12 injection today.

## 2015-02-17 NOTE — Telephone Encounter (Signed)
Received call from Whitney with [facility] re: below PA request. She states that all PPIs have been removed from their formulary and pt must purchased OTC if she is to continue the medication. They will cover pantoprazole  but PA must be obtained first and she states pt would not qualify for the pantoprazole as her current Nexium dose is  daily.  Notified pt and she voices understanding.

## 2015-02-18 ENCOUNTER — Other Ambulatory Visit: Payer: Self-pay | Admitting: Family

## 2015-02-18 ENCOUNTER — Encounter: Payer: Self-pay | Admitting: Family

## 2015-02-18 ENCOUNTER — Telehealth: Payer: Self-pay | Admitting: Family

## 2015-02-18 DIAGNOSIS — R319 Hematuria, unspecified: Secondary | ICD-10-CM

## 2015-02-18 DIAGNOSIS — E039 Hypothyroidism, unspecified: Secondary | ICD-10-CM

## 2015-02-18 DIAGNOSIS — E785 Hyperlipidemia, unspecified: Secondary | ICD-10-CM

## 2015-02-18 MED ORDER — LEVOTHYROXINE SODIUM 75 MCG PO TABS: 75.0000 ug | ORAL_TABLET | Freq: Every day | ORAL | Status: AC

## 2015-02-18 NOTE — Telephone Encounter (Signed)
See mychart.  

## 2015-02-18 NOTE — Addendum Note (Signed)
Addended by: Sandford Craze on: 02/18/2015 10:29 PM   Modules accepted: Kipp Brood

## 2015-02-19 MED ORDER — ATORVASTATIN CALCIUM 40 MG PO TABS: 40.0000 mg | ORAL_TABLET | Freq: Every day | ORAL | Status: AC

## 2015-02-19 NOTE — Addendum Note (Signed)
Addended by: Sandford Craze on: 02/19/2015 03:14 PM   Modules accepted: Orders

## 2015-02-19 NOTE — Addendum Note (Signed)
Addended by: Mervin Kung A on: 02/19/2015 02:00 PM   Modules accepted: Orders

## 2015-02-20 NOTE — Addendum Note (Signed)
Addended by: Sandford Craze on: 02/20/2015 10:03 AM   Modules accepted: Orders

## 2015-03-17 ENCOUNTER — Telehealth: Payer: Self-pay | Admitting: Family

## 2015-03-17 MED ORDER — METOPROLOL SUCCINATE ER 50 MG PO TB24: 50.0000 mg | ORAL_TABLET | Freq: Every day | ORAL | Status: AC

## 2015-03-17 MED ORDER — METOPROLOL SUCCINATE ER 50 MG PO TB24: 50.0000 mg | ORAL_TABLET | Freq: Every day | ORAL | Status: DC

## 2015-03-17 NOTE — Telephone Encounter (Signed)
Refills sent. Notified pt and she requests a 90 day supply as she just lost her job. Rx re-sent for #90.

## 2015-03-17 NOTE — Telephone Encounter (Signed)
Relationship to patient: Self   Can be reached: [phone]  Pharmacy: [facility] - [place] - [address]  Reason for call: Refill request for metoprolol succinate

## 2015-04-21 ENCOUNTER — Telehealth: Payer: Self-pay | Admitting: Family

## 2015-04-21 MED ORDER — NORETHIN ACE-ETH ESTRAD-FE 1.5-30 MG-MCG PO TABS: 1.0000 | ORAL_TABLET | Freq: Every day | ORAL | Status: AC

## 2015-04-21 NOTE — Telephone Encounter (Signed)
Patient called stating that she is having dizziness, nausea, vomiting and diarrhea x 2 days. Had chills, No fever. Transferred to [facility]. Spoke with Amy

## 2015-04-21 NOTE — Telephone Encounter (Signed)
Refills sent, left detailed message on cell#.

## 2015-04-21 NOTE — Telephone Encounter (Signed)
Pt is requesting a refill on her birth control pills   Pharmacy: [facility], [place]   CB: [phone]

## 2015-04-21 NOTE — Telephone Encounter (Signed)
Patient Name: [name] DOB: [birth year] Initial Comment Caller states c/o dizzy, nausea, diarrhea and vomiting Nurse Assessment Nurse: Yetta BarreJones, RN, Miranda Date/Time (Eastern Time): 04/21/2015 2:10:58 PM Confirm and document reason for call. If symptomatic, describe symptoms. You must click the next button to save text entered. ---Caller states she has had vomiting and diarrhea since yesterday. Last episode of emesis was 30 min ago. Has the patient traveled out of the country within the last 30 days? ---No Does the patient have any new or worsening symptoms? ---Yes Will a triage be completed? ---Yes Related visit to physician within the last 2 weeks? ---No Does the PT have any chronic conditions? (i.e. diabetes, asthma, etc.) ---Yes List chronic conditions. ---GERD, Depression, HTN Is the patient pregnant or possibly pregnant? (Ask all females between the ages of 612-55) ---No Is this a behavioral health or substance abuse call? ---No Guidelines Guideline Title Affirmed Question Affirmed Notes Vomiting Severe headache (e.g., excruciating) (Exception: similar to previous migraines) Final Disposition User Go to ED Now (or PCP triage) Yetta BarreJones, RN, Miranda Comments Missed call, sent message and parked to nurse Offered pt appt times but she states she is going to go to urgent care. She is closer to the urgent care and not sure if she can stand being in the car any longer. Referrals GO TO FACILITY OTHER - SPECIFY Disagree/Comply: Comply

## 2015-04-21 NOTE — Telephone Encounter (Signed)
Dawn Jensen FYI

## 2015-05-05 ENCOUNTER — Telehealth: Payer: Self-pay | Admitting: *Deleted

## 2015-05-05 NOTE — Telephone Encounter (Signed)
Received medical records release from [facility] to release all records to S. Kathlen BrunswickSamuelson, FNP. Request forwarded to [place] for email / scan.

## 2015-05-19 ENCOUNTER — Ambulatory Visit: Payer: Commercial Managed Care - PPO | Admitting: Family

## 2016-02-16 IMAGING — CR DG CHEST 2V
2 series · 2 of 2 positions shown · non-contrast
Comparison: 10/03/2012

CLINICAL DATA: Left rib pain with cough for 3 weeks.

EXAM:
CHEST  2 VIEW

[w chest pa]
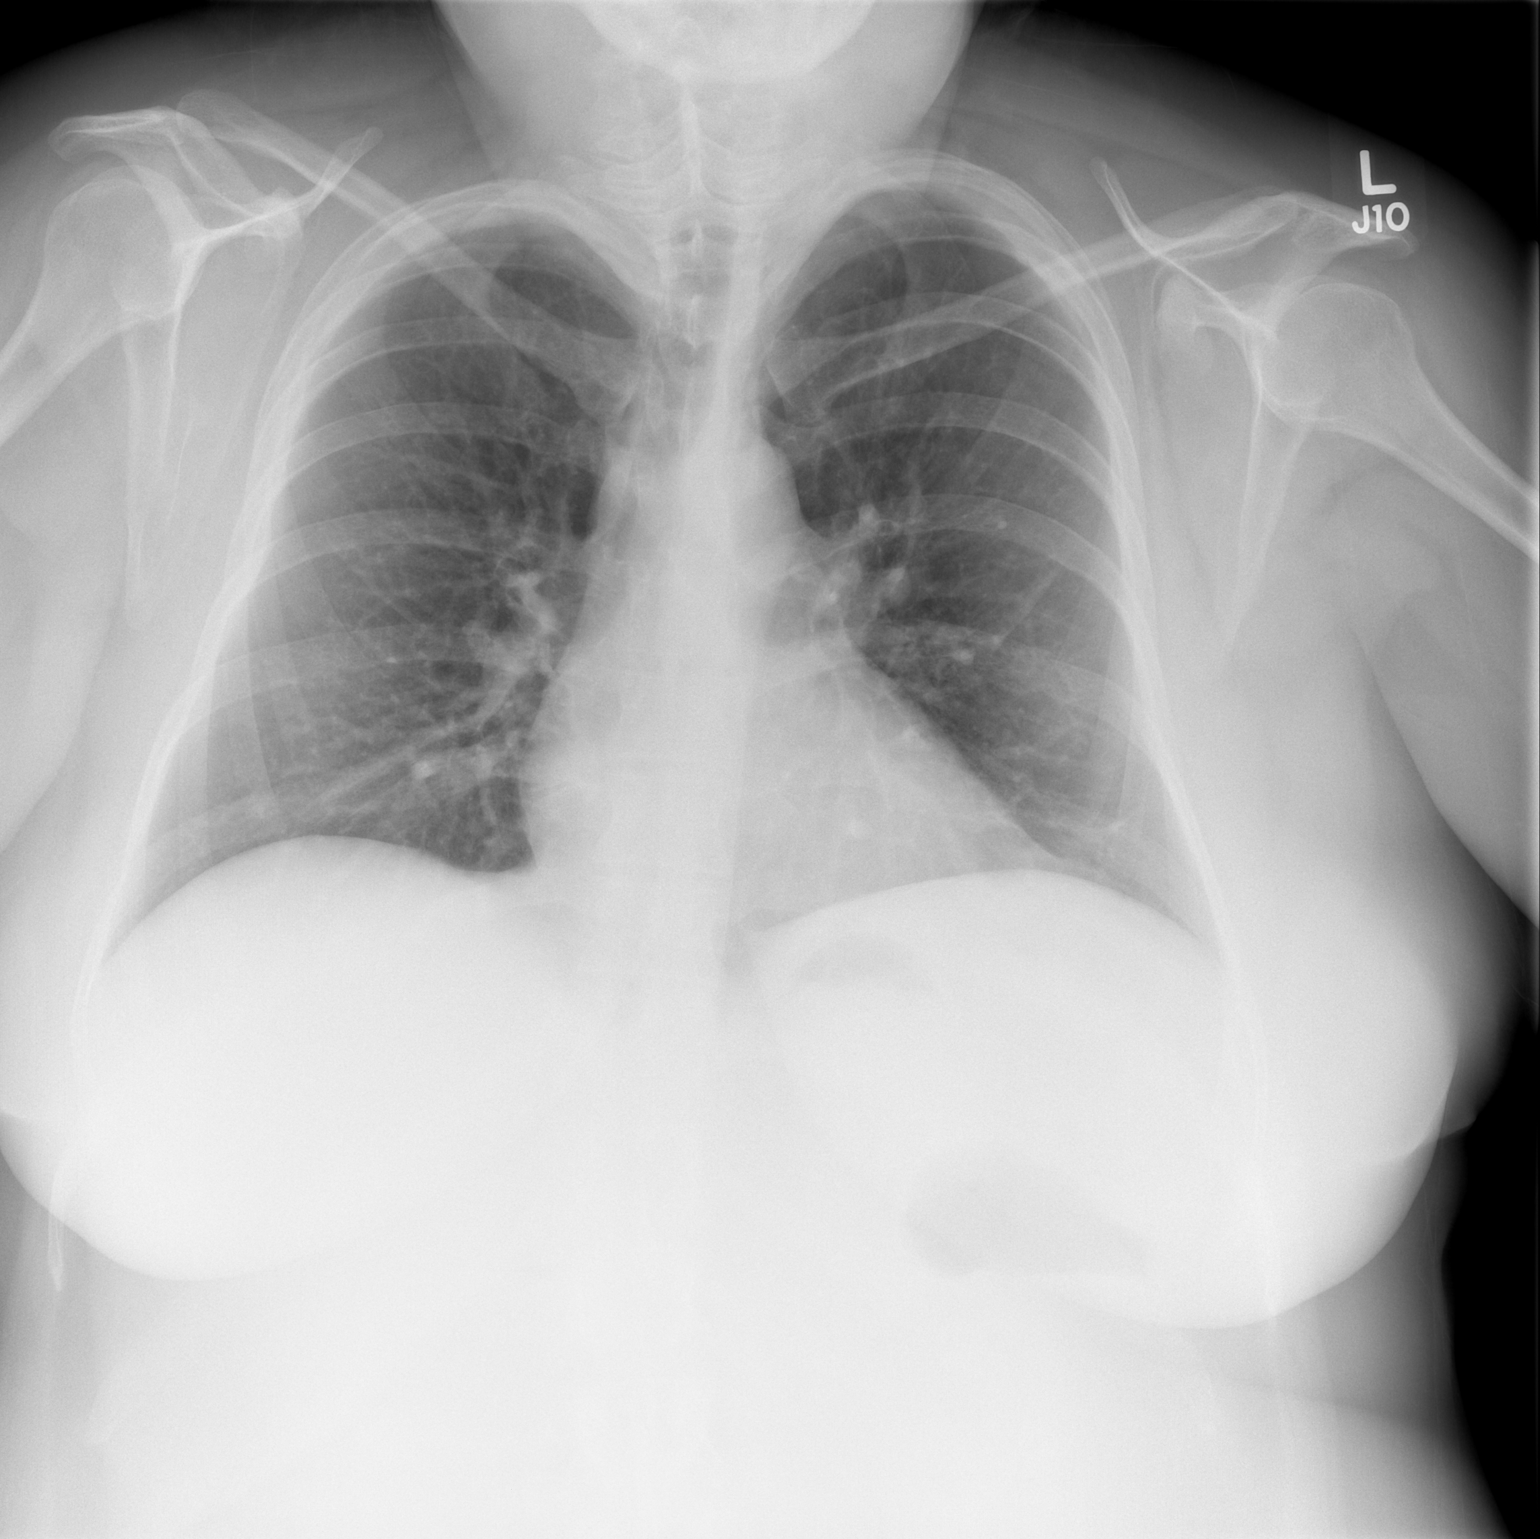

[w chest lat]
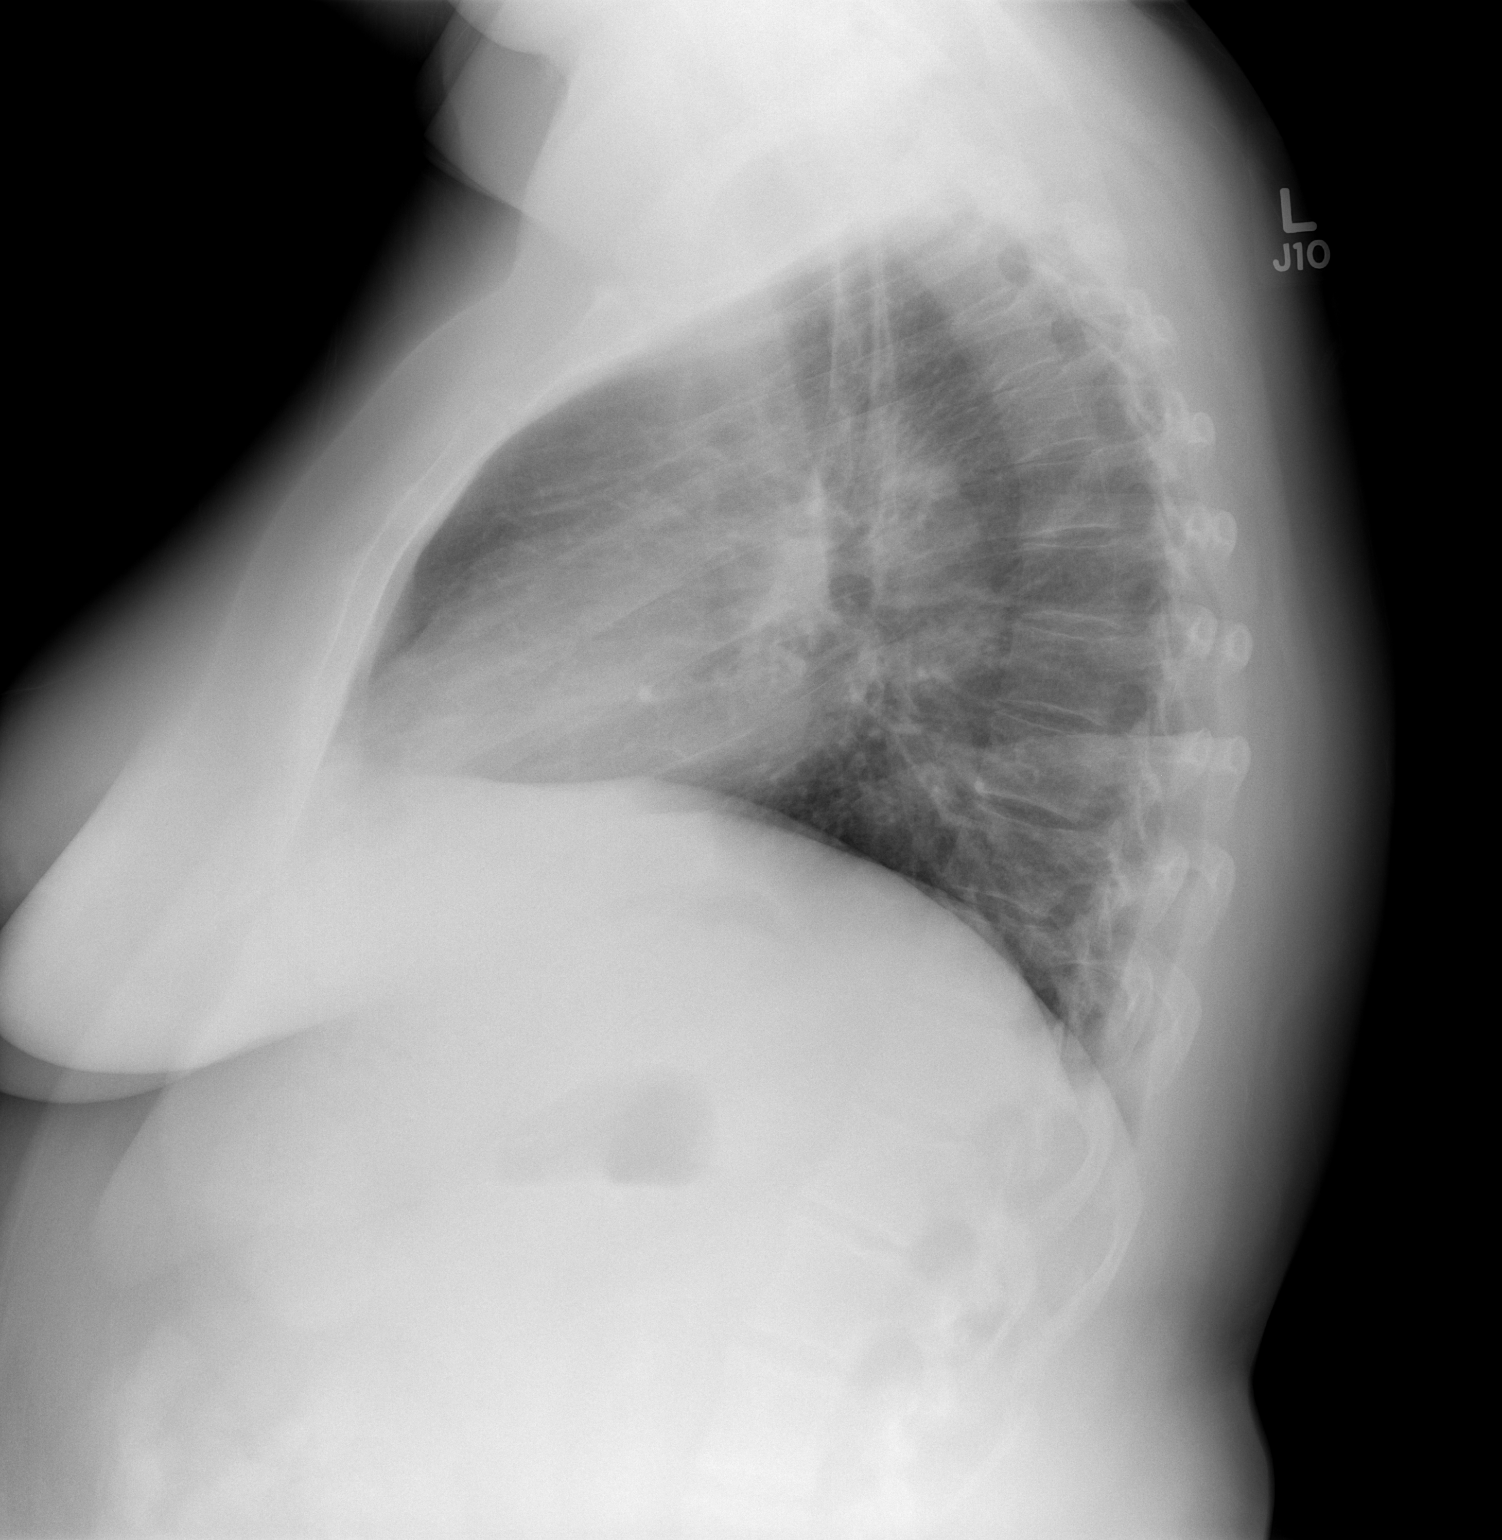

[2 of 2 positions shown; findings below may reference images not displayed]

FINDINGS: The cardiac silhouette, mediastinal and hilar contours are within
normal limits and stable. The lungs are clear. No pleural effusion.
The bony thorax is intact. No definite left-sided rib fractures.
IMPRESSION: No acute cardiopulmonary findings.

## 2017-07-06 ENCOUNTER — Ambulatory Visit: Payer: Commercial Managed Care - PPO | Admitting: Neurology

## 2017-09-13 ENCOUNTER — Encounter (HOSPITAL_BASED_OUTPATIENT_CLINIC_OR_DEPARTMENT_OTHER): Payer: Self-pay | Admitting: *Deleted

## 2017-09-13 ENCOUNTER — Other Ambulatory Visit: Payer: Self-pay

## 2017-09-13 ENCOUNTER — Emergency Department (HOSPITAL_BASED_OUTPATIENT_CLINIC_OR_DEPARTMENT_OTHER)
Admission: EM | Admit: 2017-09-13 | Discharge: 2017-09-13 | Disposition: A | Discharge status: Home / Self Care | Payer: Managed Care, Other (non HMO) | Attending: Emergency Medicine | Admitting: Emergency Medicine

## 2017-09-13 DIAGNOSIS — R197 Diarrhea, unspecified: Secondary | ICD-10-CM | POA: Insufficient documentation

## 2017-09-13 DIAGNOSIS — Z79899 Other long term (current) drug therapy: Secondary | ICD-10-CM | POA: Diagnosis not present

## 2017-09-13 DIAGNOSIS — I10 Essential (primary) hypertension: Secondary | ICD-10-CM | POA: Insufficient documentation

## 2017-09-13 DIAGNOSIS — J45909 Unspecified asthma, uncomplicated: Secondary | ICD-10-CM | POA: Insufficient documentation

## 2017-09-13 DIAGNOSIS — M791 Myalgia, unspecified site: Secondary | ICD-10-CM | POA: Insufficient documentation

## 2017-09-13 LAB — URINALYSIS, ROUTINE W REFLEX MICROSCOPIC
BILIRUBIN URINE: NEGATIVE
GLUCOSE, UA: NEGATIVE mg/dL
HGB URINE DIPSTICK: NEGATIVE
KETONES UR: NEGATIVE mg/dL
Nitrite: NEGATIVE
PH: 7 (ref 5.0–8.0)
Protein, ur: NEGATIVE mg/dL

## 2017-09-13 LAB — CBC WITH DIFFERENTIAL/PLATELET
BASOS PCT: 0 %
Basophils Absolute: 0 10*3/uL (ref 0.0–0.1)
EOS ABS: 0.2 10*3/uL (ref 0.0–0.7)
Eosinophils Relative: 2 %
HCT: 39.2 % (ref 36.0–46.0)
HEMOGLOBIN: 13.7 g/dL (ref 12.0–15.0)
LYMPHS ABS: 2.9 10*3/uL (ref 0.7–4.0)
Lymphocytes Relative: 34 %
MCH: 29 pg (ref 26.0–34.0)
MCHC: 34.9 g/dL (ref 30.0–36.0)
MCV: 82.9 fL (ref 78.0–100.0)
MONO ABS: 0.7 10*3/uL (ref 0.1–1.0)
MONOS PCT: 8 %
NEUTROS PCT: 56 %
Neutro Abs: 4.7 10*3/uL (ref 1.7–7.7)
Platelets: 373 10*3/uL (ref 150–400)
RBC: 4.73 MIL/uL (ref 3.87–5.11)
RDW: 13.6 % (ref 11.5–15.5)
WBC: 8.5 10*3/uL (ref 4.0–10.5)

## 2017-09-13 LAB — COMPREHENSIVE METABOLIC PANEL
ALBUMIN: 3.6 g/dL (ref 3.5–5.0)
ALK PHOS: 75 U/L (ref 38–126)
ALT: 40 U/L (ref 0–44)
AST: 27 U/L (ref 15–41)
Anion gap: 12 (ref 5–15)
BUN: 11 mg/dL (ref 6–20)
CALCIUM: 8.2 mg/dL — AB (ref 8.9–10.3)
CHLORIDE: 107 mmol/L (ref 98–111)
CO2: 21 mmol/L — AB (ref 22–32)
CREATININE: 0.72 mg/dL (ref 0.44–1.00)
GFR calc non Af Amer: >60 mL/min (ref >60)
GLUCOSE: 86 mg/dL (ref 70–99)
Potassium: 3.2 mmol/L — ABNORMAL LOW (ref 3.5–5.1)
SODIUM: 140 mmol/L (ref 135–145)
Total Bilirubin: 0.3 mg/dL (ref 0.3–1.2)
Total Protein: 6.6 g/dL (ref 6.5–8.1)

## 2017-09-13 LAB — LIPASE, BLOOD: LIPASE: 21 U/L (ref 11–51)

## 2017-09-13 LAB — URINALYSIS, MICROSCOPIC (REFLEX)

## 2017-09-13 MED ORDER — SODIUM CHLORIDE 0.9 % IV BOLUS
1000.0000 mL | Freq: Once | INTRAVENOUS | Status: AC
  Administered 2017-09-13: 1000 mL via INTRAVENOUS

## 2017-09-13 MED ORDER — KETOROLAC TROMETHAMINE 15 MG/ML IJ SOLN
15.0000 mg | Freq: Once | INTRAMUSCULAR | Status: AC
  Administered 2017-09-13: 15 mg via INTRAVENOUS
  Filled 2017-09-13: qty 1

## 2017-09-13 MED ORDER — DICYCLOMINE HCL 20 MG PO TABS: 20.0000 mg | ORAL_TABLET | Freq: Two times a day (BID) | ORAL | 0 refills | Status: AC | PRN

## 2017-09-13 MED ORDER — MORPHINE SULFATE (PF) 4 MG/ML IV SOLN
4.0000 mg | Freq: Once | INTRAVENOUS | Status: AC
  Administered 2017-09-13: 4 mg via INTRAVENOUS
  Filled 2017-09-13: qty 1

## 2017-09-13 MED ORDER — ONDANSETRON HCL 4 MG/2ML IJ SOLN
4.0000 mg | Freq: Once | INTRAMUSCULAR | Status: AC
  Administered 2017-09-13: 4 mg via INTRAVENOUS
  Filled 2017-09-13: qty 2

## 2017-09-13 MED ORDER — DICYCLOMINE HCL 10 MG PO CAPS
10.0000 mg | ORAL_CAPSULE | Freq: Once | ORAL | Status: AC
  Administered 2017-09-13: 10 mg via ORAL
  Filled 2017-09-13: qty 1

## 2017-09-13 NOTE — ED Notes (Signed)
Patient states she increased her percocet to 2 tablets 3 times a day on Friday; states she took her last dose today.

## 2017-09-13 NOTE — ED Triage Notes (Signed)
Body aches, chills and diarrhea. She took Percocet and Flexeril with no relief.

## 2017-09-13 NOTE — ED Notes (Signed)
Gave patient sprite for po challenge.  

## 2017-09-13 NOTE — ED Provider Notes (Signed)
[facility] EMERGENCY DEPARTMENT Provider Note   CSN: [id number] Arrival date & time: 09/13/17  1940     History   Chief Complaint Chief Complaint  Patient presents with  . Generalized Body Aches  . Chills    HPI Dawn Jensen is a 42 y.o. female.  The history is provided by the patient. No language interpreter was used.   Dawn BolkShannon S Eddins is a 42 y.o. female who presents to the Emergency Department complaining of diarrhea, body aches. She presents to the emergency department complaining of diarrhea and body aches that began this morning. She reports waking up with multiple episodes of diarrhea with lower abdominal cramping. No hematochezia or melena. No reports of fevers. She does endorse chills and generalized body aches. She takes Percocet for chronic back pain and has been taking it to Percocet TID for the last two days. No known sick contacts but she does work in the healthcare system. She did have some ranch dressing yesterday, which made her has been sick a few days ago when he ate the same dressing. No sore throat, cough, dysuria, hematuria. Past Medical History:  Diagnosis Date  . Allergy   . Asthma    sees Dr. Gary FleetWhalen  . Depression    sees Valinda HoarMeredith Baker NP  . GERD (gastroesophageal reflux disease)   . Hyperlipidemia   . Hypertension   . IBS (irritable bowel syndrome)   . Migraines    sees Hali Marryrystal Rose NP at [facility]   . PPD positive, treated    treated with inh    Patient Active Problem List   Diagnosis Date Noted  . Vitamin D deficiency 11/19/2014  . Rib fracture 11/04/2014  . Weight gain 11/04/2014  . OSA (obstructive sleep apnea) 11/04/2014  . Cough 10/03/2012  . Contraception management 09/12/2012  . B12 deficiency 02/16/2011  . Essential hypertension 09/01/2007  . Asthma 09/01/2007  . GERD 09/01/2007  . TMJ SYNDROME 02/13/2007  . DERMATITIS, ALLERGIC 08/22/2006  . Hyperlipidemia 06/14/2006  . Depression 06/14/2006  .  Migraine 06/14/2006  . ALLERGIC RHINITIS 06/14/2006  . IRRITABLE BOWEL SYNDROME 06/14/2006    Past Surgical History:  Procedure Laterality Date  . ADENOIDECTOMY    . TYMPANOSTOMY TUBE PLACEMENT     bilateral ears, as a child     OB History   None      Home Medications    Prior to Admission medications   Medication Sig Start Date End Date Taking? Authorizing Provider  ARIPiprazole (ABILIFY) 10 MG tablet Take 1 tablet (10 mg total) by mouth daily. 01/23/15  Yes Sandford Craze'Sullivan, Melissa, NP  betamethasone dipropionate (DIPROLENE) 0.05 % cream Apply topically 2 (two) times daily. 02/17/15  Yes Sandford Craze'Sullivan, Melissa, NP  esomeprazole (NEXIUM) 40 MG capsule Take 1 capsule (40 mg total) by mouth daily before breakfast. 09/14/12  Yes Sandford Craze'Sullivan, Melissa, NP  fluticasone (FLONASE) 50 MCG/ACT nasal spray Place 2 sprays into the nose daily. 09/12/12  Yes Sandford Craze'Sullivan, Melissa, NP  hydrOXYzine (ATARAX/VISTARIL) 25 MG tablet Take 1 tablet (25 mg total) by mouth as needed. 11/14/14  Yes Sandford Craze'Sullivan, Melissa, NP  levothyroxine (SYNTHROID, LEVOTHROID) 75 MCG tablet Take 1 tablet (75 mcg total) by mouth daily. 02/18/15  Yes Sandford Craze'Sullivan, Melissa, NP  loratadine (CLARITIN) 10 MG tablet Take 1 tablet (10 mg total) by mouth daily. 11/19/14  Yes Sandford Craze'Sullivan, Melissa, NP  metoprolol succinate (TOPROL-XL) 50 MG 24 hr tablet Take 1 tablet (50 mg total) by mouth daily. Take with or immediately following a  meal. 03/17/15  Yes Sandford Craze, NP  norethindrone-ethinyl estradiol-iron (MICROGESTIN FE,GILDESS FE,LOESTRIN FE) 1.5-30 MG-MCG tablet Take 1 tablet by mouth daily. 04/21/15  Yes Sandford Craze, NP  ondansetron (ZOFRAN-ODT) 4 MG disintegrating tablet Take 4 mg by mouth every 8 (eight) hours as needed. 09/02/14  Yes [provider]  PARoxetine (PAXIL) 40 MG tablet Take 1 tablet (40 mg total) by mouth every morning. 11/14/14  Yes Sandford Craze, NP  Pitavastatin Calcium (LIVALO) 2 MG TABS Take 1 tablet by  mouth daily.   Yes [provider]  Vitamin D, Ergocalciferol, (DRISDOL) 50000 UNITS CAPS capsule Take 1 capsule (50,000 Units total) by mouth every 7 (seven) days. 11/19/14  Yes Sandford Craze, NP  zolmitriptan (ZOMIG-ZMT) 5 MG disintegrating tablet Take 5 mg by mouth as needed.   Yes [provider]  atorvastatin (LIPITOR) 40 MG tablet Take 1 tablet (40 mg total) by mouth daily. 02/19/15   Sandford Craze, NP  diclofenac (CATAFLAM) 50 MG tablet Take 50 mg by mouth daily as needed.    [provider]  dicyclomine (BENTYL) 20 MG tablet Take 1 tablet (20 mg total) by mouth 2 (two) times daily as needed for spasms. 09/13/17   Tilden Fossa, MD  losartan (COZAAR) 100 MG tablet Take 100 mg by mouth daily.    [provider]    Family History Family History  Problem Relation Age of Onset  . Hyperlipidemia Mother   . Diabetes Mother   . Hyperthyroidism Mother   . Hypertension Mother   . Cancer Mother        breast  . ADD / ADHD Sister   . Osteoporosis Maternal Grandmother   . Diabetes Maternal Grandmother   . Hypertension Maternal Grandmother   . Heart disease Maternal Grandmother   . Stroke Maternal Grandfather 60  . Cancer Paternal Grandfather        brain cancer    Social History Social History   Tobacco Use  . Smoking status: Never Smoker  . Smokeless tobacco: Never Used  Substance Use Topics  . Alcohol use: Yes    Alcohol/week: 0.0 standard drinks    Comment: rarely  . Drug use: No     Allergies   Tizanidine; Amoxicillin-pot clavulanate; Erythromycin; Lisinopril; Moxifloxacin; Statins; Sulfamethoxazole; Topamax [topiramate]; Penicillins; and Tape   Review of Systems Review of Systems  All other systems reviewed and are negative.    Physical Exam Updated Vital Signs BP (!) 132/97 (BP Location: Left Arm)   Pulse 86   Temp 99.1 F (37.3 C) (Oral)   Resp 16   Ht 5\' 2"  (1.575 m)   Wt 99.8 kg   SpO2 98%   BMI 40.24  kg/m   Physical Exam  Constitutional: She is oriented to person, place, and time. She appears well-developed and well-nourished.  HENT:  Head: Normocephalic and atraumatic.  Cardiovascular: Normal rate and regular rhythm.  No murmur heard. Pulmonary/Chest: Effort normal and breath sounds normal. No respiratory distress.  Abdominal: Soft. There is no rebound and no guarding.  Mild lower abdominal tenderness without guarding or rebound.    Musculoskeletal: She exhibits no edema or tenderness.  Neurological: She is alert and oriented to person, place, and time.  Skin: Skin is warm and dry.  Psychiatric: She has a normal mood and affect. Her behavior is normal.  Nursing note and vitals reviewed.    ED Treatments / Results  Labs (all labs ordered are listed, but only abnormal results are displayed) Labs Reviewed  URINALYSIS, ROUTINE W REFLEX MICROSCOPIC - Abnormal; Notable for the following components:      Result Value   APPearance HAZY (*)    Specific Gravity, Urine <1.005 (*)    Leukocytes, UA SMALL (*)    All other components within normal limits  URINALYSIS, MICROSCOPIC (REFLEX) - Abnormal; Notable for the following components:   Bacteria, UA RARE (*)    All other components within normal limits  COMPREHENSIVE METABOLIC PANEL - Abnormal; Notable for the following components:   Potassium 3.2 (*)    CO2 21 (*)    Calcium 8.2 (*)    All other components within normal limits  CBC WITH DIFFERENTIAL/PLATELET  LIPASE, BLOOD    EKG None  Radiology No results found.  Procedures Procedures (including critical care time)  Medications Ordered in ED Medications  sodium chloride 0.9 % bolus 1,000 mL ( Intravenous Stopped 09/13/17 2213)  ondansetron (ZOFRAN) injection 4 mg (4 mg Intravenous Given 09/13/17 2054)  dicyclomine (BENTYL) capsule 10 mg (10 mg Oral Given 09/13/17 2054)  ketorolac (TORADOL) 15 MG/ML injection 15 mg (15 mg Intravenous Given 09/13/17 2154)  morphine 4  MG/ML injection 4 mg (4 mg Intravenous Given 09/13/17 2226)     Initial Impression / Assessment and Plan / ED Course  I have reviewed the triage vital signs and the nursing notes.  Pertinent labs & imaging results that were available during my care of the patient were reviewed by me and considered in my medical decision making (see chart for details).     Should here for evaluation of nausea, chills, body aches and diarrhea. A family member has had similar symptoms upon exposure to same food. She is non-toxic appearing on examination with mild abdominal tenderness, no peritoneal findings. Labs are reassuring with mild hypokalemia. Discussed with patient possible foodborne illness versus diverticulitis. Offered imaging of the abdomen to r/o diverticulitis and patient declines at this time. Plan to treat symptomatically with oral fluid hydration, antiemetics at home. Discussed importance of outpatient follow-up and return precautions.  Final Clinical Impressions(s) / ED Diagnoses   Final diagnoses:  Diarrhea of presumed infectious origin    ED Discharge Orders         Ordered    dicyclomine (BENTYL) 20 MG tablet  2 times daily PRN     09/13/17 2244           Tilden Fossa, MD 09/13/17 2345

## 2021-01-05 ENCOUNTER — Encounter (HOSPITAL_BASED_OUTPATIENT_CLINIC_OR_DEPARTMENT_OTHER): Payer: Self-pay | Admitting: Emergency Medicine

## 2021-01-05 ENCOUNTER — Emergency Department (HOSPITAL_BASED_OUTPATIENT_CLINIC_OR_DEPARTMENT_OTHER)
Admission: EM | Admit: 2021-01-05 | Discharge: 2021-01-05 | Disposition: A | Discharge status: Home / Self Care | Payer: Commercial Managed Care - PPO | Attending: Emergency Medicine | Admitting: Emergency Medicine

## 2021-01-05 ENCOUNTER — Other Ambulatory Visit: Payer: Self-pay

## 2021-01-05 DIAGNOSIS — Z7951 Long term (current) use of inhaled steroids: Secondary | ICD-10-CM | POA: Diagnosis not present

## 2021-01-05 DIAGNOSIS — R200 Anesthesia of skin: Secondary | ICD-10-CM

## 2021-01-05 DIAGNOSIS — R202 Paresthesia of skin: Secondary | ICD-10-CM | POA: Insufficient documentation

## 2021-01-05 DIAGNOSIS — Z79899 Other long term (current) drug therapy: Secondary | ICD-10-CM | POA: Insufficient documentation

## 2021-01-05 DIAGNOSIS — J45909 Unspecified asthma, uncomplicated: Secondary | ICD-10-CM | POA: Insufficient documentation

## 2021-01-05 DIAGNOSIS — I10 Essential (primary) hypertension: Secondary | ICD-10-CM | POA: Diagnosis not present

## 2021-01-05 MED ORDER — IBUPROFEN 800 MG PO TABS: 800.0000 mg | ORAL_TABLET | Freq: Three times a day (TID) | ORAL | 0 refills | Status: AC | PRN

## 2021-01-05 NOTE — Discharge Instructions (Signed)
I suspect your numbness is related to carpal tunnel syndrome. Please call your neurologist today to schedule and appointment to discuss further management of this.   Please wear the brace we have given you, rest your wrist as you are able, and take ibuprofen as needed.  Return if development of any new or worsening symptoms

## 2021-01-05 NOTE — ED Provider Notes (Signed)
[facility] EMERGENCY DEPARTMENT Provider Note   CSN: [id number] Arrival date & time: 01/05/21  0945     History Chief Complaint  Patient presents with   Hand Problem    Dawn Jensen is a 45 y.o. female.  Patient with history of migraine, hypertension, hypertension, and CVA presents today with complaint of right hand numbness.  She states that symptoms been ongoing intermittently for the past month but usually resolves.  This morning around 3 AM she woke up her third fourth and fifth fingers completely numb.  She states she also had a headache, however states that she has a history of migraines and this headache was exactly like her normal migraine, she took 800 mg of ibuprofen and had resolution of her headache, however her hand numbness continued.  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 She also states that she had a stroke 3-4 years ago that presented with left sided weakness and facial droop. She says these symptoms resolved without intervention and she has no lasting deficits. She states that her neurologist suspected this was from her migraines and placed her only on 81mg  ASA for antiplatelet therapy.  She states that the numbness in her hand ends at her wrist.  Denies any neck pain.  No other deficits, no fevers, chills, nausea, vomiting.  She is alert and oriented and ambulatory without difficulty.  The history is provided by the patient. No language interpreter was used.      Past Medical History:  Diagnosis Date   Allergy    Asthma    sees Dr.   Depression    sees Gary Fleet NP   GERD (gastroesophageal reflux disease)    Hyperlipidemia    Hypertension    IBS (irritable bowel syndrome)    Migraines    sees Valinda Hoar NP at [facility]    PPD positive, treated    treated with inh    Patient Active Problem List   Diagnosis Date Noted   Vitamin D deficiency 11/19/2014    Rib fracture 11/04/2014   Weight gain 11/04/2014   OSA (obstructive sleep apnea) 11/04/2014   Cough 10/03/2012   Contraception management 09/12/2012   B12 deficiency 02/16/2011   Essential hypertension 09/01/2007   Asthma 09/01/2007   GERD 09/01/2007   TMJ SYNDROME 02/13/2007   DERMATITIS, ALLERGIC 08/22/2006   Hyperlipidemia 06/14/2006   Depression 06/14/2006   Migraine 06/14/2006   ALLERGIC RHINITIS 06/14/2006   IRRITABLE BOWEL SYNDROME 06/14/2006    Past Surgical History:  Procedure Laterality Date   ADENOIDECTOMY     TYMPANOSTOMY TUBE PLACEMENT     bilateral ears, as a child     OB History   No obstetric history on file.     Family History  Problem Relation Age of Onset   Hyperlipidemia Mother    Diabetes Mother    Hyperthyroidism Mother    Hypertension Mother    Cancer Mother        breast   ADD / ADHD Sister    Osteoporosis Maternal Grandmother    Diabetes Maternal Grandmother    Hypertension Maternal Grandmother    Heart disease Maternal Grandmother    Stroke Maternal Grandfather 80   Cancer Paternal Grandfather        brain cancer    Social History   Tobacco Use   Smoking status: Never   Smokeless tobacco: Never  Substance Use Topics  Alcohol use: Yes    Alcohol/week: 0.0 standard drinks    Comment: rarely   Drug use: No    Home Medications Prior to Admission medications   Medication Sig Start Date End Date Taking? Authorizing Provider  ARIPiprazole (ABILIFY) 10 MG tablet Take 1 tablet (10 mg total) by mouth daily. 01/23/15   Sandford Craze, NP  atorvastatin (LIPITOR) 40 MG tablet Take 1 tablet (40 mg total) by mouth daily. 02/19/15   Sandford Craze, NP  betamethasone dipropionate (DIPROLENE) 0.05 % cream Apply topically 2 (two) times daily. 02/17/15   Sandford Craze, NP  diclofenac (CATAFLAM) 50 MG tablet Take 50 mg by mouth daily as needed.    [provider]  dicyclomine (BENTYL) 20 MG tablet Take 1 tablet (20 mg total)  by mouth 2 (two) times daily as needed for spasms. 09/13/17   Tilden Fossa, MD  esomeprazole (NEXIUM) 40 MG capsule Take 1 capsule (40 mg total) by mouth daily before breakfast. 09/14/12   Sandford Craze, NP  fluticasone (FLONASE) 50 MCG/ACT nasal spray Place 2 sprays into the nose daily. 09/12/12   Sandford Craze, NP  hydrOXYzine (ATARAX/VISTARIL) 25 MG tablet Take 1 tablet (25 mg total) by mouth as needed. 11/14/14   Sandford Craze, NP  levothyroxine (SYNTHROID, LEVOTHROID) 75 MCG tablet Take 1 tablet (75 mcg total) by mouth daily. 02/18/15   Sandford Craze, NP  loratadine (CLARITIN) 10 MG tablet Take 1 tablet (10 mg total) by mouth daily. 11/19/14   Sandford Craze, NP  losartan (COZAAR) 100 MG tablet Take 100 mg by mouth daily.    [provider]  metoprolol succinate (TOPROL-XL) 50 MG 24 hr tablet Take 1 tablet (50 mg total) by mouth daily. Take with or immediately following a meal. 03/17/15   Sandford Craze, NP  norethindrone-ethinyl estradiol-iron (MICROGESTIN FE,GILDESS FE,LOESTRIN FE) 1.5-30 MG-MCG tablet Take 1 tablet by mouth daily. 04/21/15   Sandford Craze, NP  ondansetron (ZOFRAN-ODT) 4 MG disintegrating tablet Take 4 mg by mouth every 8 (eight) hours as needed. 09/02/14   [provider]  PARoxetine (PAXIL) 40 MG tablet Take 1 tablet (40 mg total) by mouth every morning. 11/14/14   Sandford Craze, NP  Pitavastatin Calcium (LIVALO) 2 MG TABS Take 1 tablet by mouth daily.    [provider]  Vitamin D, Ergocalciferol, (DRISDOL) 50000 UNITS CAPS capsule Take 1 capsule (50,000 Units total) by mouth every 7 (seven) days. 11/19/14   Sandford Craze, NP  zolmitriptan (ZOMIG-ZMT) 5 MG disintegrating tablet Take 5 mg by mouth as needed.    [provider]    Allergies    Erythromycin, Tizanidine, Topiramate, Lisinopril, Moxifloxacin, Sulfamethoxazole, and Tape  Review of Systems   Review of Systems  Constitutional:   Negative for chills and fever.  Respiratory:  Negative for shortness of breath.   Cardiovascular:  Negative for chest pain.  Gastrointestinal:  Negative for diarrhea, nausea and vomiting.  Musculoskeletal:  Negative for gait problem, joint swelling and neck pain.  Skin:  Negative for rash.  Neurological:  Positive for headaches (now resolved). Negative for dizziness, tremors, seizures, syncope, facial asymmetry, speech difficulty, weakness, light-headedness and numbness.  Psychiatric/Behavioral:  Negative for confusion and decreased concentration.   All other systems reviewed and are negative.  Physical Exam Updated Vital Signs BP 119/72    Pulse 68    Temp 98 F (36.7 C) (Oral)    Resp 20    Ht 5\' 2"  (1.575 m)    Wt 113.4 kg  LMP 12/08/2020 (Approximate)    SpO2 95%    BMI 45.73 kg/m   Physical Exam Vitals and nursing note reviewed.  Constitutional:      General: She is not in acute distress.    Appearance: Normal appearance. She is normal weight. She is not ill-appearing, toxic-appearing or diaphoretic.     Comments: Patient resting comfortably in bed in no distress.  HENT:     Head: Normocephalic and atraumatic.     Mouth/Throat:     Mouth: Mucous membranes are moist.  Eyes:     Extraocular Movements: Extraocular movements intact.     Conjunctiva/sclera: Conjunctivae normal.     Pupils: Pupils are equal, round, and reactive to light.  Cardiovascular:     Rate and Rhythm: Normal rate and regular rhythm.     Heart sounds: Normal heart sounds.  Pulmonary:     Effort: Pulmonary effort is normal. No respiratory distress.     Breath sounds: Normal breath sounds.  Abdominal:     General: Abdomen is flat.     Palpations: Abdomen is soft.  Musculoskeletal:        General: Normal range of motion.     Cervical back: Normal range of motion and neck supple. No tenderness.     Comments: Numbness noted to dorsal and ventral aspect of the third fourth and fifth fingers at the wrist.   Capillary refill less than 2 seconds, finger are warm and well perfused.  Patient with difficulty extending these fingers but flexes them with 5/5 strength. Phalen's and Tinnel's sign negative  Skin:    General: Skin is warm and dry.  Neurological:     General: No focal deficit present.     Mental Status: She is alert and oriented to person, place, and time.     GCS: GCS eye subscore is 4. GCS verbal subscore is 5. GCS motor subscore is 6.     Cranial Nerves: No cranial nerve deficit.     Sensory: Sensory deficit present.     Motor: Motor function is intact. No weakness.     Coordination: Coordination is intact. Coordination normal.     Gait: Gait is intact. Gait normal.     Comments: Alert and oriented to self, place, time and event.    Speech is fluent, clear without dysarthria or dysphasia.    Strength 5/5 in upper/lower extremities   Sensation intact in upper extremity ending at the wrist of the right hand. Numbness to third fourth and fifth fingers   CN I not tested  CN II grossly intact visual fields bilaterally. Did not visualize posterior eye.  CN III, IV, VI PERRLA and EOMs intact bilaterally  CN V Intact sensation to sharp and light touch to the face  CN VII facial movements symmetric  CN VIII not tested  CN IX, X no uvula deviation, symmetric rise of soft palate  CN XI 5/5 SCM and trapezius strength bilaterally  CN XII Midline tongue protrusion, symmetric L/R movements   Psychiatric:        Mood and Affect: Mood normal.        Behavior: Behavior normal.    ED Results / Procedures / Treatments   Labs (all labs ordered are listed, but only abnormal results are displayed) Labs Reviewed - No data to display  EKG None  Radiology No results found.  Procedures Procedures   Medications Ordered in ED Medications - No data to display  ED Course  I have reviewed the  triage vital signs and the nursing notes.  Pertinent labs & imaging results that were available  during my care of the patient were reviewed by me and considered in my medical decision making (see chart for details).    MDM Rules/Calculators/A&P                         Patient presents today with 3rd 4th and 5th finger numbness on the right hand. No other neurologic deficits. History of CVA with completely different deficits at that time, all of which are resolved. Numbness ends at the distal right wrist. She is entirely neurologically intact otherwise. Therefore extremely low suspicion for central cause of her symptoms. She denies any recent illness. Numbness has not been progressing since onset, and in the presence of no other symptoms in other locations, low suspicion for demyelinating disease such as GBS.   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 Suspect patients symptoms are likely due to carpal tunnel syndrome. Will give neurology referral for her to see for further management and give brace and anti-inflammatories in the interim.  Patient is amenable with this plan, educated on red flag symptoms that would prompt immediate return.  Discharged in stable condition.  Findings and plan of care discussed with supervising physician Dr. Charm Barges who is in agreement.    Final Clinical Impression(s) / ED Diagnoses Final diagnoses:  Numbness and tingling in right hand    Rx / DC Orders ED Discharge Orders     None     An After Visit Summary was printed and given to the patient.    Vear Clock 01/05/21 1110    Terrilee Files, MD 01/05/21 [phone]

## 2021-01-05 NOTE — ED Triage Notes (Signed)
Intermittent right arm and hand numbness.  Now 3rd, 4th, and 5th fingers are numb since yesterday, lunchtime.  Woke up at 3 am, with headache, had more numbness.  Took ibuprofen, resolved the headache but still has the 3-4-5th finger numbness on right hand.  No other neuro deficits.

## 2021-10-25 DIAGNOSIS — M5432 Sciatica, left side: Secondary | ICD-10-CM | POA: Diagnosis not present

## 2021-10-25 DIAGNOSIS — M5431 Sciatica, right side: Secondary | ICD-10-CM | POA: Diagnosis not present

## 2021-10-25 DIAGNOSIS — K0510 Chronic gingivitis, plaque induced: Secondary | ICD-10-CM | POA: Diagnosis not present

## 2021-10-25 DIAGNOSIS — S025XXB Fracture of tooth (traumatic), initial encounter for open fracture: Secondary | ICD-10-CM | POA: Diagnosis not present

## 2021-11-02 DIAGNOSIS — M79604 Pain in right leg: Secondary | ICD-10-CM | POA: Diagnosis not present

## 2021-11-02 DIAGNOSIS — Z888 Allergy status to other drugs, medicaments and biological substances status: Secondary | ICD-10-CM | POA: Diagnosis not present

## 2021-11-02 DIAGNOSIS — R519 Headache, unspecified: Secondary | ICD-10-CM | POA: Diagnosis not present

## 2021-11-02 DIAGNOSIS — M47897 Other spondylosis, lumbosacral region: Secondary | ICD-10-CM | POA: Diagnosis not present

## 2021-11-02 DIAGNOSIS — I10 Essential (primary) hypertension: Secondary | ICD-10-CM | POA: Diagnosis not present

## 2021-11-02 DIAGNOSIS — M5386 Other specified dorsopathies, lumbar region: Secondary | ICD-10-CM | POA: Diagnosis not present

## 2021-11-02 DIAGNOSIS — M5441 Lumbago with sciatica, right side: Secondary | ICD-10-CM | POA: Diagnosis not present

## 2021-11-02 DIAGNOSIS — R0902 Hypoxemia: Secondary | ICD-10-CM | POA: Diagnosis not present

## 2021-11-02 DIAGNOSIS — M5431 Sciatica, right side: Secondary | ICD-10-CM | POA: Diagnosis not present

## 2021-11-02 DIAGNOSIS — Z882 Allergy status to sulfonamides status: Secondary | ICD-10-CM | POA: Diagnosis not present

## 2021-11-02 DIAGNOSIS — R200 Anesthesia of skin: Secondary | ICD-10-CM | POA: Diagnosis not present

## 2021-11-05 DIAGNOSIS — G8191 Hemiplegia, unspecified affecting right dominant side: Secondary | ICD-10-CM | POA: Diagnosis not present

## 2021-11-05 DIAGNOSIS — Z91148 Patient's other noncompliance with medication regimen for other reason: Secondary | ICD-10-CM | POA: Diagnosis not present

## 2021-11-05 DIAGNOSIS — R29810 Facial weakness: Secondary | ICD-10-CM | POA: Diagnosis not present

## 2021-11-05 DIAGNOSIS — Z91128 Patient's intentional underdosing of medication regimen for other reason: Secondary | ICD-10-CM | POA: Diagnosis not present

## 2021-11-05 DIAGNOSIS — R202 Paresthesia of skin: Secondary | ICD-10-CM | POA: Diagnosis not present

## 2021-11-05 DIAGNOSIS — F419 Anxiety disorder, unspecified: Secondary | ICD-10-CM | POA: Diagnosis not present

## 2021-11-05 DIAGNOSIS — I779 Disorder of arteries and arterioles, unspecified: Secondary | ICD-10-CM | POA: Diagnosis not present

## 2021-11-05 DIAGNOSIS — G8194 Hemiplegia, unspecified affecting left nondominant side: Secondary | ICD-10-CM | POA: Diagnosis not present

## 2021-11-05 DIAGNOSIS — Z8673 Personal history of transient ischemic attack (TIA), and cerebral infarction without residual deficits: Secondary | ICD-10-CM | POA: Diagnosis not present

## 2021-11-05 DIAGNOSIS — R200 Anesthesia of skin: Secondary | ICD-10-CM | POA: Diagnosis not present

## 2021-11-05 DIAGNOSIS — E782 Mixed hyperlipidemia: Secondary | ICD-10-CM | POA: Diagnosis not present

## 2021-11-05 DIAGNOSIS — R531 Weakness: Secondary | ICD-10-CM | POA: Diagnosis not present

## 2021-11-05 DIAGNOSIS — I10 Essential (primary) hypertension: Secondary | ICD-10-CM | POA: Diagnosis not present

## 2021-11-05 DIAGNOSIS — M6281 Muscle weakness (generalized): Secondary | ICD-10-CM | POA: Diagnosis not present

## 2021-11-05 DIAGNOSIS — M5416 Radiculopathy, lumbar region: Secondary | ICD-10-CM | POA: Diagnosis not present

## 2021-11-05 DIAGNOSIS — M5136 Other intervertebral disc degeneration, lumbar region: Secondary | ICD-10-CM | POA: Diagnosis not present

## 2021-11-05 DIAGNOSIS — F331 Major depressive disorder, recurrent, moderate: Secondary | ICD-10-CM | POA: Diagnosis not present

## 2021-11-06 DIAGNOSIS — I10 Essential (primary) hypertension: Secondary | ICD-10-CM | POA: Diagnosis not present

## 2021-11-06 DIAGNOSIS — M5136 Other intervertebral disc degeneration, lumbar region: Secondary | ICD-10-CM | POA: Diagnosis not present

## 2021-11-06 DIAGNOSIS — F419 Anxiety disorder, unspecified: Secondary | ICD-10-CM | POA: Diagnosis not present

## 2021-11-06 DIAGNOSIS — R531 Weakness: Secondary | ICD-10-CM | POA: Diagnosis not present

## 2021-11-10 DIAGNOSIS — M549 Dorsalgia, unspecified: Secondary | ICD-10-CM | POA: Diagnosis not present

## 2021-11-10 DIAGNOSIS — E785 Hyperlipidemia, unspecified: Secondary | ICD-10-CM | POA: Diagnosis not present

## 2021-11-10 DIAGNOSIS — G8929 Other chronic pain: Secondary | ICD-10-CM | POA: Diagnosis not present

## 2021-11-10 DIAGNOSIS — I639 Cerebral infarction, unspecified: Secondary | ICD-10-CM | POA: Diagnosis not present

## 2021-11-17 DIAGNOSIS — E785 Hyperlipidemia, unspecified: Secondary | ICD-10-CM | POA: Diagnosis not present

## 2021-11-17 DIAGNOSIS — Z8673 Personal history of transient ischemic attack (TIA), and cerebral infarction without residual deficits: Secondary | ICD-10-CM | POA: Diagnosis not present

## 2021-11-17 DIAGNOSIS — I10 Essential (primary) hypertension: Secondary | ICD-10-CM | POA: Diagnosis not present

## 2021-11-17 DIAGNOSIS — I6522 Occlusion and stenosis of left carotid artery: Secondary | ICD-10-CM | POA: Diagnosis not present

## 2021-11-18 DIAGNOSIS — M21611 Bunion of right foot: Secondary | ICD-10-CM | POA: Diagnosis not present

## 2021-12-11 DIAGNOSIS — J40 Bronchitis, not specified as acute or chronic: Secondary | ICD-10-CM | POA: Diagnosis not present
